# Patient Record
Sex: Male | Born: 1935 | Race: White | Hispanic: No | Marital: Married | Smoking: Former smoker
Health system: Southern US, Community
[De-identification: ages and names within clinical notes are randomized; demographics above are authoritative.]

## PROBLEM LIST (undated history)

## (undated) DIAGNOSIS — Z86010 Personal history of colonic polyps: Secondary | ICD-10-CM

## (undated) DIAGNOSIS — Z85828 Personal history of other malignant neoplasm of skin: Secondary | ICD-10-CM

## (undated) HISTORY — DX: Personal history of other malignant neoplasm of skin: Z85.828

## (undated) HISTORY — PX: OTHER SURGICAL HISTORY: SHX169

## (undated) HISTORY — DX: Personal history of colonic polyps: Z86.010

---

## 2004-04-27 ENCOUNTER — Ambulatory Visit: Payer: Self-pay | Admitting: Internal Medicine

## 2006-05-31 ENCOUNTER — Ambulatory Visit: Payer: Self-pay | Admitting: Internal Medicine

## 2006-07-11 ENCOUNTER — Ambulatory Visit: Payer: Self-pay | Admitting: Internal Medicine

## 2006-07-24 ENCOUNTER — Ambulatory Visit: Payer: Self-pay | Admitting: Internal Medicine

## 2007-04-06 ENCOUNTER — Ambulatory Visit: Payer: Self-pay | Admitting: Internal Medicine

## 2008-03-11 ENCOUNTER — Ambulatory Visit: Payer: Self-pay | Admitting: Internal Medicine

## 2008-06-11 ENCOUNTER — Telehealth: Payer: Self-pay | Admitting: Internal Medicine

## 2008-12-26 ENCOUNTER — Ambulatory Visit: Payer: Self-pay | Admitting: Family Medicine

## 2009-01-05 ENCOUNTER — Ambulatory Visit: Payer: Self-pay | Admitting: Internal Medicine

## 2009-01-30 ENCOUNTER — Ambulatory Visit: Payer: Self-pay | Admitting: Internal Medicine

## 2009-03-05 ENCOUNTER — Encounter (INDEPENDENT_AMBULATORY_CARE_PROVIDER_SITE_OTHER): Payer: Self-pay | Admitting: *Deleted

## 2009-03-23 ENCOUNTER — Ambulatory Visit: Payer: Self-pay | Admitting: Internal Medicine

## 2009-03-23 DIAGNOSIS — D485 Neoplasm of uncertain behavior of skin: Secondary | ICD-10-CM | POA: Insufficient documentation

## 2009-04-27 ENCOUNTER — Encounter (INDEPENDENT_AMBULATORY_CARE_PROVIDER_SITE_OTHER): Payer: Self-pay | Admitting: *Deleted

## 2009-04-28 ENCOUNTER — Ambulatory Visit: Payer: Self-pay | Admitting: Gastroenterology

## 2009-05-14 ENCOUNTER — Ambulatory Visit: Payer: Self-pay | Admitting: Gastroenterology

## 2009-05-14 LAB — HM COLONOSCOPY

## 2009-05-28 ENCOUNTER — Encounter: Payer: Self-pay | Admitting: Gastroenterology

## 2009-07-15 ENCOUNTER — Ambulatory Visit: Payer: Self-pay | Admitting: Internal Medicine

## 2010-02-17 ENCOUNTER — Ambulatory Visit: Payer: Self-pay | Admitting: Internal Medicine

## 2010-06-29 NOTE — Assessment & Plan Note (Signed)
Summary: flu shot/cjr  Nurse Visit   Vitals Entered By: Duard Brady LPN (February 17, 2010 3:57 PM)  Allergies: No Known Drug Allergies  Orders Added: 1)  Admin 1st Vaccine [90471] 2)  Flu Vaccine 60yrs + [57846] Flu Vaccine Consent Questions     Do you have a history of severe allergic reactions to this vaccine? no    Any prior history of allergic reactions to egg and/or gelatin? no    Do you have a sensitivity to the preservative Thimersol? no    Do you have a past history of Guillan-Barre Syndrome? no    Do you currently have an acute febrile illness? no    Have you ever had a severe reaction to latex? no    Vaccine information given and explained to patient? yes    Are you currently pregnant? no    Lot Number:[id number]   Exp Date:11/27/2010   Site Given  Left Deltoid IM .lbflu

## 2010-06-29 NOTE — Assessment & Plan Note (Signed)
Summary: growth on ear/njr   Vital Signs:  Patient profile:   77 year old male Height:      71.5 inches Weight:      190 pounds BMI:     26.22 Temp:     97.9 degrees F oral Pulse rate:   80 / minute Resp:     12 per minute BP sitting:   132 / 86  (left arm) Cuff size:   regular  Vitals Entered By: Gladis Riffle, RN (July 15, 2009 11:02 AM)  Procedure Note Last Tetanus: Historical (01/29/1999)  Mole Biopsy/Removal: Onset of lesion: several months  Procedure # 1: liquid nitrogen    Size (in cm): .5 x .5    Location: right pinna    Comment: verbal consent  CC: c/o abnormal crusting growth on right ear x 3-4 weeks--has had one frozen off before Is Patient Diabetic? No Comments itching of ear has resolved   CC:  c/o abnormal crusting growth on right ear x 3-4 weeks--has had one frozen off before.  History of Present Illness: mole right pinna---tender to touch or sleep on scaley minimally red  Preventive Screening-Counseling & Management  Alcohol-Tobacco     Smoking Status: quit  Current Medications (verified): 1)  Desonide 0.05 % Crea (Desonide) .... Apply Two Times A Day To Ear As Needed Itching  Allergies (verified): No Known Drug Allergies   Impression & Recommendations:  Problem # 1:  LESION, FACE (ICD-238.2)  now on ear---suspect SCCA  Orders: Cryotherapy/Destruction benign or premalignant lesion (1st lesion)  (17000)  Complete Medication List: 1)  Desonide 0.05 % Crea (Desonide) .... Apply two times a day to ear as needed itching

## 2010-08-11 ENCOUNTER — Encounter (HOSPITAL_COMMUNITY): Payer: Medicare HMO | Attending: Pulmonary Disease

## 2010-08-17 ENCOUNTER — Encounter (HOSPITAL_COMMUNITY): Payer: Medicare HMO

## 2010-08-19 ENCOUNTER — Encounter (HOSPITAL_COMMUNITY): Payer: Medicare HMO

## 2010-08-24 ENCOUNTER — Encounter (HOSPITAL_COMMUNITY): Payer: Medicare HMO

## 2010-08-26 ENCOUNTER — Encounter (HOSPITAL_COMMUNITY): Payer: Medicare HMO

## 2010-08-31 ENCOUNTER — Encounter (HOSPITAL_COMMUNITY): Payer: Self-pay

## 2010-09-02 ENCOUNTER — Encounter (HOSPITAL_COMMUNITY): Payer: Self-pay

## 2010-09-07 ENCOUNTER — Encounter (HOSPITAL_COMMUNITY): Payer: Self-pay

## 2010-09-07 ENCOUNTER — Encounter: Payer: Self-pay | Admitting: Internal Medicine

## 2010-09-08 ENCOUNTER — Encounter: Payer: Self-pay | Admitting: Family Medicine

## 2010-09-08 ENCOUNTER — Ambulatory Visit (INDEPENDENT_AMBULATORY_CARE_PROVIDER_SITE_OTHER): Payer: Medicare HMO | Admitting: Family Medicine

## 2010-09-08 VITALS — BP 140/94 | Temp 98.4°F | Ht 70.25 in | Wt 184.0 lb

## 2010-09-08 DIAGNOSIS — L570 Actinic keratosis: Secondary | ICD-10-CM

## 2010-09-08 NOTE — Progress Notes (Signed)
  Subjective:    Patient ID: Brian Welch, male    DOB: [birth year], 77 y.o.   MRN: [id number]  HPI Patient seen with some scaly spots on his face mostly right cheek and right side of nose. Has been treated previously with liquid nitrogen. These have increased over the past several weeks. No bleeding or itching. He is not aware of any prior history of skin cancer. Has had many years of sun exposure to face.   Review of Systems     Objective:   Physical Exam  Constitutional: He is oriented to person, place, and time. He appears well-developed and well-nourished.  Cardiovascular: Normal rate and regular rhythm.  Exam reveals no gallop.   Pulmonary/Chest: Effort normal and breath sounds normal. He has no wheezes. He has no rales.  Neurological: He is alert and oriented to person, place, and time.  Skin:       Patient has a few scattered areas of rash on his face. He has 2 spots on right side of the nose and one right cheek area all of the erythematous base and scaly hyperkeratotic surface. No pigmentary change. Multiple prominent telangiectasias. No clear nodular or ulcerative changes.          Assessment & Plan:  Probable actinic keratoses face. Recommend treatment with liquid nitrogen. After discussion of risks and benefits patient consents. These are treated without difficulty. He is encouraged to followup with Dr. Cato Mulligan in a couple months to reassess

## 2010-09-08 NOTE — Patient Instructions (Signed)
Actinic Keratosis Actinic keratosis is a growth on the skin that is considered to be precancerous. That means it could develop into skin cancer if it is not treated. About 1% of such growths will turn into skin cancer, therefore, it is important that the skin growth is removed. An actinic keratosis might be as small as a pinhead or as big as a quarter. They appear most often on areas of skin that get a lot of sun exposure throughout your life. These include the bald scalp, face, ears, lips, upper back and the backs of hands and forearms. An actinic keratosis usually looks like a scaly, rough spot of skin. Sometimes there might be a little tag of pink or gray skin growing off them.  CAUSES Sun damage causes abnormal growth of skin cells. Actinic keratoses (more than one growth) are the result of sun damage. You are more likely to develop them if you:  Have light-colored skin.   Are older (actinic keratoses increases with age).   Sunburn easily.   Have spent a lot of time in the sun.   Have had a job that involves a lot of outdoor work, such as a Hydrographic surveyor.  SYMPTOMS  Blotchy, red and white patches of skin.   A skin patch that feels rough (like sandpaper), scaly or crusty.   Patches of dry, white skin on the lips.   A patch of skin that is thinner than normal.   Skin that is tender to the touch.   Although a rare symptom; an area of skin that bleeds.  DIAGNOSIS To decide if you have actinic keratosis, your caregiver will probably:  Ask about symptoms you have noticed.   Ask about your history of exposure to the sun.   Ask about your overall health history.   Examine the skin that concerns you. The caregiver may want to look at skin on other parts of your body that have had a lot of sun exposure.   Order a biopsy. A biopsy is the removal of a small sample of tissue from the patch of skin. It is then examined for signs of cancer.  TREATMENT Actinic keratosis can be  treated several ways. Most of the time, treatments can be done in a clinic or in your caregiver's office. Be sure to discuss the different options with your caregiver. They include:  Surgical removal (curettage). A special surgical instrument (a curette) is used to scrape the growth.   Cryosurgery. Liquid nitrogen is used to freeze the patch of skin. Often it is sprayed on the area. The growth will eventually fall off.   5-FU (5-fluorouracil) cream. The cream is applied several times a day for up to 4 weeks. The skin often becomes red and irritated, but the growths will go away. This method is often used if actinic keratoses or the skin is badly damaged.   Chemical peel. Chemicals are applied to the skin on a small spot. The outer layers of skin at that spot are then peeled off.   Photodynamic therapy. A drug (photosensitizing agent) is put on the skin before exposing the skin to a strong light. Together, the drug and strong light destroys the actinic keratoses.   Imiquimod cream. A medicine usually applied to growths on the face or scalp.  PROGNOSIS Early treatment of actinic keratoses usually gets rid of the growths without further worries. POSSIBLE COMPLICATIONS  Skin irritation or redness from the treatment.   Scarring where the patch of skin was  removed.   Development of skin cancer. This rarely happens if the growths are removed early on.  HOME CARE INSTRUCTIONS  An adhesive bandage, and possibly gauze, will cover the treated area.   Change and remove the bandage as directed by your caregiver.   Keep the treated area dry as directed by your caregiver.   Apply any creams that your caregiver prescribed. Follow the directions carefully.   To prevent future skin damage:   Wear sunscreen year-round, not just in the summer. The winter sun can damage skin, too.   Wear long-sleeved clothing and wide-brimmed hats.   When possible, avoid midday exposure to the sun.   If you want  to look tan, try sunless tanning products (lotions and sprays). Avoid tanning beds.   Commit to regularly checking your skin for new changes.   Visit a skin doctor (dermatologist ) every year for a skin exam.  SEEK MEDICAL CARE IF:  The treated area of skin does not heal and becomes more irritated, red or bloody.   You notice other patches of skin that are similar to the actinic keratoses that were treated.  Document Released: 08/12/2008 Document Re-Released: 11/03/2009 [facility] Patient Information 2011 [place], [place].

## 2010-09-09 ENCOUNTER — Encounter (HOSPITAL_COMMUNITY): Payer: Self-pay

## 2010-09-14 ENCOUNTER — Encounter (HOSPITAL_COMMUNITY): Payer: Self-pay

## 2010-09-16 ENCOUNTER — Encounter (HOSPITAL_COMMUNITY): Payer: Self-pay

## 2010-09-21 ENCOUNTER — Encounter (HOSPITAL_COMMUNITY): Payer: Self-pay

## 2010-09-23 ENCOUNTER — Encounter (HOSPITAL_COMMUNITY): Payer: Self-pay

## 2010-09-28 ENCOUNTER — Encounter (HOSPITAL_COMMUNITY): Payer: Self-pay

## 2010-09-30 ENCOUNTER — Encounter (HOSPITAL_COMMUNITY): Payer: Self-pay

## 2010-10-05 ENCOUNTER — Encounter (HOSPITAL_COMMUNITY): Payer: Self-pay

## 2010-10-07 ENCOUNTER — Encounter (HOSPITAL_COMMUNITY): Payer: Self-pay

## 2010-10-12 ENCOUNTER — Encounter (HOSPITAL_COMMUNITY): Payer: Self-pay

## 2010-10-14 ENCOUNTER — Encounter (HOSPITAL_COMMUNITY): Payer: Self-pay

## 2010-10-19 ENCOUNTER — Encounter (HOSPITAL_COMMUNITY): Payer: Self-pay

## 2010-10-21 ENCOUNTER — Encounter (HOSPITAL_COMMUNITY): Payer: Self-pay

## 2010-10-26 ENCOUNTER — Encounter (HOSPITAL_COMMUNITY): Payer: Self-pay

## 2010-10-28 ENCOUNTER — Encounter (HOSPITAL_COMMUNITY): Payer: Self-pay

## 2010-11-02 ENCOUNTER — Encounter (HOSPITAL_COMMUNITY): Payer: Self-pay

## 2010-11-04 ENCOUNTER — Encounter (HOSPITAL_COMMUNITY): Payer: Self-pay

## 2010-11-09 ENCOUNTER — Encounter (HOSPITAL_COMMUNITY): Payer: Self-pay

## 2010-11-10 ENCOUNTER — Encounter: Payer: Self-pay | Admitting: Internal Medicine

## 2010-11-10 ENCOUNTER — Ambulatory Visit (INDEPENDENT_AMBULATORY_CARE_PROVIDER_SITE_OTHER): Payer: Medicare HMO | Admitting: Internal Medicine

## 2010-11-10 DIAGNOSIS — L570 Actinic keratosis: Secondary | ICD-10-CM

## 2010-11-10 DIAGNOSIS — D485 Neoplasm of uncertain behavior of skin: Secondary | ICD-10-CM

## 2010-11-11 ENCOUNTER — Encounter (HOSPITAL_COMMUNITY): Payer: Self-pay

## 2010-11-16 ENCOUNTER — Encounter (HOSPITAL_COMMUNITY): Payer: Self-pay

## 2010-11-18 ENCOUNTER — Encounter (HOSPITAL_COMMUNITY): Payer: Self-pay

## 2010-11-18 NOTE — Progress Notes (Signed)
  Subjective:    Patient ID: Brian Welch, male    DOB: 08/11/1931, 77 y.o.   MRN: [id number]  HPI  77 year old male comes in for evaluation. He really has no significant complaints. He is concerned with a few lesions on his skin.No past medical history on file. No past surgical history on file.  reports that he quit smoking about 37 years ago. His smoking use included Cigarettes. He has a 15 pack-year smoking history. He does not have any smokeless tobacco history on file. His alcohol and drug histories not on file. family history includes Stroke in his other and Tuberculosis in his other. No Known Allergies   Review of Systems  patient denies chest pain, shortness of breath, orthopnea. Denies lower extremity edema, abdominal pain, change in appetite, change in bowel movements. Patient denies rashes, musculoskeletal complaints. No other specific complaints in a complete review of systems.      Objective:   Physical Exam Well-developed male in no acute distress. Full skin check does not reveal any concerning lesions. He does have a few actinic keratoses.       Assessment & Plan:

## 2010-11-18 NOTE — Assessment & Plan Note (Signed)
Small actinic keratoses without any concerning findings. No treatment necessary.

## 2010-11-23 ENCOUNTER — Encounter (HOSPITAL_COMMUNITY): Payer: Self-pay

## 2010-11-25 ENCOUNTER — Encounter (HOSPITAL_COMMUNITY): Payer: Self-pay

## 2011-04-13 ENCOUNTER — Ambulatory Visit: Payer: Medicare HMO | Admitting: *Deleted

## 2011-04-14 ENCOUNTER — Ambulatory Visit: Payer: Medicare HMO | Admitting: *Deleted

## 2011-08-05 ENCOUNTER — Ambulatory Visit (INDEPENDENT_AMBULATORY_CARE_PROVIDER_SITE_OTHER): Payer: Medicare Other | Admitting: Family

## 2011-08-05 ENCOUNTER — Encounter: Payer: Self-pay | Admitting: Family

## 2011-08-05 ENCOUNTER — Ambulatory Visit: Payer: Medicare HMO | Admitting: Family

## 2011-08-05 VITALS — BP 138/88 | Temp 97.5°F | Wt 186.0 lb

## 2011-08-05 DIAGNOSIS — D489 Neoplasm of uncertain behavior, unspecified: Secondary | ICD-10-CM

## 2011-08-05 NOTE — Progress Notes (Signed)
  Subjective:    Patient ID: Brian Welch, male    DOB: 06/03/1931, 78 y.o.   MRN: [id number]  HPI 78 y/o WM is in today with c/o lesion on his right nose and left ear. He is requesting to have them removed.    Review of Systems  Constitutional: Negative.   Respiratory: Negative.   Cardiovascular: Negative.   Skin:       Skin lesion to the right nose and left ear.   Hematological: Negative.        Objective:   Physical Exam  Constitutional: He is oriented to person, place, and time. He appears well-developed and well-nourished.  Cardiovascular: Normal rate, regular rhythm and normal heart sounds.   Pulmonary/Chest: Effort normal and breath sounds normal.  Neurological: He is alert and oriented to person, place, and time.  Skin: Skin is dry.       Right nose: palpable flesh tone, vascular appearing lesion noted. Approx 3mm in size.   Left ear: Rough 2mm lesion noted to the left tip of the ear. Flesh tone.    Right nose: Informed consent was given by the patient for a shave biopsy. The site was prepped with Betadine and using a 15 blade a 4mm shave biopsy was obtained. The specimin was placed in preservative and sent for pathology. Hemostasis was achieved with a compression. Wound care was discussed with the patient. The patient was informed that it would be one to 2 weeks before the pathology will be interpreted.  Left ear: Informed consent was given by the patient for a shave biopsy. The site was prepped with Betadine and using a 15 blade a 2mm shave biopsy was obtained. The specimin was placed in preservative and sent for pathology. Hemostasis was achieved with a compression. Wound care was discussed with the patient. The patient was informed that it would be one to 2 weeks before the pathology will be interpreted.      Assessment & Plan:  Assessment: Neoplasm uncertain significance  Plan: Lesions sent to pathology. Apply triple antibiotic ointment. Discussed s/s of infection.  Recheck as scheduled and prn.

## 2011-10-12 ENCOUNTER — Telehealth: Payer: Self-pay | Admitting: *Deleted

## 2011-10-12 ENCOUNTER — Encounter: Payer: Self-pay | Admitting: Internal Medicine

## 2011-10-12 ENCOUNTER — Ambulatory Visit (INDEPENDENT_AMBULATORY_CARE_PROVIDER_SITE_OTHER): Payer: Medicare Other | Admitting: Internal Medicine

## 2011-10-12 VITALS — BP 160/90 | HR 64 | Temp 98.7°F | Ht 71.0 in | Wt 179.0 lb

## 2011-10-12 DIAGNOSIS — Z0000 Encounter for general adult medical examination without abnormal findings: Secondary | ICD-10-CM

## 2011-10-12 DIAGNOSIS — Z23 Encounter for immunization: Secondary | ICD-10-CM

## 2011-10-12 NOTE — Telephone Encounter (Signed)
Per Dr. Cato Mulligan, no need for labs or urine, and wife notified.

## 2011-10-12 NOTE — Telephone Encounter (Signed)
Can call patient. Really not necessary, there is nothing to order

## 2011-10-12 NOTE — Progress Notes (Signed)
Patient ID: Brian Welch, male   DOB: 02/27/1932, 78 y.o.   MRN: [id number] cpx  No past medical history on file.  History   Social History  . Marital Status: Married    Spouse Name: N/A    Number of Children: N/A  . Years of Education: N/A   Occupational History  . Not on file.   Social History Main Topics  . Smoking status: Former Smoker -- 1.0 packs/day for 15 years    Types: Cigarettes    Quit date: 09/07/1973  . Smokeless tobacco: Not on file  . Alcohol Use: Not on file  . Drug Use: Not on file  . Sexually Active: Not on file   Other Topics Concern  . Not on file   Social History Narrative  . No narrative on file    No past surgical history on file.  Family History  Problem Relation Age of Onset  . Stroke Other   . Tuberculosis Other     No Known Allergies  No current outpatient prescriptions on file prior to visit.     patient denies chest pain, shortness of breath, orthopnea. Denies lower extremity edema, abdominal pain, change in appetite, change in bowel movements. Patient denies rashes, musculoskeletal complaints. No other specific complaints in a complete review of systems.   BP 160/90  Pulse 64  Temp(Src) 98.7 F (37.1 C) (Oral)  Ht 5\' 11"  (1.803 m)  Wt 179 lb (81.194 kg)  BMI 24.97 kg/m2 Well-developed male in no acute distress. HEENT exam atraumatic, normocephalic, extraocular muscles are intact. Conjunctivae are pink without exudate. Neck is supple without lymphadenopathy, thyromegaly, jugular venous distention. Chest is clear to auscultation without increased work of breathing. Cardiac exam S1-S2 are regular. The PMI is normal. No significant murmurs or gallops. Abdominal exam active bowel sounds, soft, nontender. No abdominal bruits. Extremities no clubbing cyanosis or edema. Peripheral pulses are normal without bruits. Neurologic exam alert and oriented without any motor or sensory deficits.  Well Visit: health maint UTD He has no  identifiable medical problems

## 2011-10-12 NOTE — Patient Instructions (Signed)
Call your insurance company and see if they will cover shingles vaccine. If they will, call us and we will give it to you  

## 2011-10-12 NOTE — Telephone Encounter (Signed)
Wife called and thought that her husband was supposed to have labs and urine this am.  Please call her and let her know if we should do that.

## 2012-03-13 ENCOUNTER — Ambulatory Visit (INDEPENDENT_AMBULATORY_CARE_PROVIDER_SITE_OTHER): Payer: Medicare Other

## 2012-03-13 DIAGNOSIS — Z23 Encounter for immunization: Secondary | ICD-10-CM

## 2012-12-17 ENCOUNTER — Ambulatory Visit (INDEPENDENT_AMBULATORY_CARE_PROVIDER_SITE_OTHER): Payer: Medicare Other | Admitting: Internal Medicine

## 2012-12-17 ENCOUNTER — Encounter: Payer: Self-pay | Admitting: Internal Medicine

## 2012-12-17 VITALS — BP 140/80 | HR 92 | Temp 97.5°F | Resp 18 | Wt 185.0 lb

## 2012-12-17 DIAGNOSIS — L570 Actinic keratosis: Secondary | ICD-10-CM

## 2012-12-17 NOTE — Progress Notes (Signed)
  Subjective:    Patient ID: Brian Welch, male    DOB: [birth year], 79 y.o.   MRN: [id number]  HPI  79 year old patient who presents with a chief complaint of facial skin lesions. He states that he has had a number of actinic keratoses removed or treated with cryotherapy.  He has seen Dr. Terri Piedra in the past. Last year he did have a very small BCE remove the left ear  No past medical history on file.  History   Social History  . Marital Status: Married    Spouse Name: N/A    Number of Children: N/A  . Years of Education: N/A   Occupational History  . Not on file.   Social History Main Topics  . Smoking status: Former Smoker -- 1.00 packs/day for 15 years    Types: Cigarettes    Quit date: 09/07/1973  . Smokeless tobacco: Not on file  . Alcohol Use: Not on file  . Drug Use: Not on file  . Sexually Active: Not on file   Other Topics Concern  . Not on file   Social History Narrative  . No narrative on file    No past surgical history on file.  Family History  Problem Relation Age of Onset  . Stroke Other   . Tuberculosis Other     No Known Allergies  Current Outpatient Prescriptions on File Prior to Visit  Medication Sig Dispense Refill  . Cholecalciferol (VITAMIN D-3 PO) Take by mouth.      . co-enzyme Q-10 30 MG capsule Take 30 mg by mouth 3 (three) times daily.      . Garlic 10 MG CAPS Take by mouth.      . Multiple Vitamins-Minerals (EYE VITAMINS PO) Take by mouth.      . NON FORMULARY k-2      . Omega-3 Fatty Acids (FISH OIL) 1000 MG CPDR Take by mouth.      . RESVERATROL 100 MG CAPS Take by mouth.       No current facility-administered medications on file prior to visit.    BP 140/80  Pulse 92  Temp(Src) 97.5 F (36.4 C) (Oral)  Resp 18  Wt 185 lb (83.915 kg)  BMI 25.81 kg/m2  SpO2 96%       Review of Systems  Skin: Positive for rash.       Objective:   Physical Exam  HENT:  Examinational left ear revealed no evidence of recurrent BCE   Skin:  Face with moderate solar changes Scattered very small actinic keratoses were noted especially involving the right temporal area          Assessment & Plan:   Actinic keratoses. It was suggested that he followup with Dr. Terri Piedra on an annual basis. He was also encouraged to schedule an annual exam with his primary care provider

## 2012-12-17 NOTE — Patient Instructions (Signed)
Follow Dr. Terri Piedra for annual skin exam  Schedule a annual exam with your primary care provider at your convenience

## 2013-02-19 ENCOUNTER — Ambulatory Visit (INDEPENDENT_AMBULATORY_CARE_PROVIDER_SITE_OTHER): Payer: Medicare Other

## 2013-02-19 DIAGNOSIS — Z23 Encounter for immunization: Secondary | ICD-10-CM

## 2014-02-10 ENCOUNTER — Ambulatory Visit (INDEPENDENT_AMBULATORY_CARE_PROVIDER_SITE_OTHER): Payer: Medicare HMO | Admitting: *Deleted

## 2014-02-10 DIAGNOSIS — Z23 Encounter for immunization: Secondary | ICD-10-CM

## 2014-02-26 ENCOUNTER — Encounter: Payer: Self-pay | Admitting: Gastroenterology

## 2014-05-26 ENCOUNTER — Ambulatory Visit: Payer: Medicare Other | Admitting: Family Medicine

## 2014-06-16 ENCOUNTER — Encounter: Payer: Self-pay | Admitting: Family Medicine

## 2014-06-16 DIAGNOSIS — Z860100 Personal history of colon polyps, unspecified: Secondary | ICD-10-CM

## 2014-06-16 DIAGNOSIS — Z85828 Personal history of other malignant neoplasm of skin: Secondary | ICD-10-CM

## 2014-06-16 HISTORY — DX: Personal history of other malignant neoplasm of skin: Z85.828

## 2014-06-16 HISTORY — DX: Personal history of colonic polyps: Z86.010

## 2014-06-16 HISTORY — DX: Personal history of colon polyps, unspecified: Z86.0100

## 2014-06-17 ENCOUNTER — Encounter: Payer: Self-pay | Admitting: Family Medicine

## 2014-06-17 ENCOUNTER — Ambulatory Visit (INDEPENDENT_AMBULATORY_CARE_PROVIDER_SITE_OTHER): Payer: Medicare HMO | Admitting: Family Medicine

## 2014-06-17 VITALS — BP 138/82 | Temp 97.5°F | Wt 172.0 lb

## 2014-06-17 DIAGNOSIS — IMO0001 Reserved for inherently not codable concepts without codable children: Secondary | ICD-10-CM

## 2014-06-17 DIAGNOSIS — Z23 Encounter for immunization: Secondary | ICD-10-CM

## 2014-06-17 DIAGNOSIS — Z0000 Encounter for general adult medical examination without abnormal findings: Secondary | ICD-10-CM

## 2014-06-17 LAB — COMPREHENSIVE METABOLIC PANEL
ALBUMIN: 4.4 g/dL (ref 3.5–5.2)
ALT: 20 U/L (ref 0–53)
AST: 22 U/L (ref 0–37)
Alkaline Phosphatase: 64 U/L (ref 39–117)
BILIRUBIN TOTAL: 0.6 mg/dL (ref 0.2–1.2)
BUN: 16 mg/dL (ref 6–23)
CALCIUM: 9.6 mg/dL (ref 8.4–10.5)
CO2: 30 mEq/L (ref 19–32)
Chloride: 103 mEq/L (ref 96–112)
Creatinine, Ser: 0.89 mg/dL (ref 0.40–1.50)
GFR: 87.31 mL/min (ref >60.00)
Glucose, Bld: 101 mg/dL — ABNORMAL HIGH (ref 70–99)
Potassium: 4.1 mEq/L (ref 3.5–5.1)
SODIUM: 138 meq/L (ref 135–145)
TOTAL PROTEIN: 7.3 g/dL (ref 6.0–8.3)

## 2014-06-17 LAB — CBC
HEMATOCRIT: 49.9 % (ref 39.0–52.0)
HEMOGLOBIN: 16.7 g/dL (ref 13.0–17.0)
MCHC: 33.4 g/dL (ref 30.0–36.0)
MCV: 99.1 fl (ref 78.0–100.0)
PLATELETS: 228 10*3/uL (ref 150.0–400.0)
RBC: 5.04 Mil/uL (ref 4.22–5.81)
RDW: 12.7 % (ref 11.5–15.5)
WBC: 9 10*3/uL (ref 4.0–10.5)

## 2014-06-17 NOTE — Progress Notes (Signed)
Garret Reddish, MD Phone: [phone]  Subjective:  Patient presents today for their annual physical. Chief complaint-noted.   Home BP monitoring-last few home readings are 115/73, 126/68, 118/77, 124/73  Superoxide diometoze before breakfast With breakfast takes garlic, eye vitamins (cataract surgery, sees eye doctor yearly) Oatmeal with some prunes, coconut oil, protein powder Tumeric every other day, coq10, d3, reserveterol  Lunch-1/3 a can of sardines, 1/3 avocado, leftover vegetable, makes smoothie out of romaine, kale, magnesium, green powder, whey protein  Walk daily 2 miles, bikes sometimes, pushups twice a week along with yoga poses along with loud music. Yoga one day a week and zumba on Sunday. Chops wood in winter.   1 banana per day.   Dinner small portions of meat or fish (seldom beef, avoids sausage). Vegetables with dinner.   Sees dentist yearly. Had some gum recession. Starting on probiotic.   ROS- Full ROS completed and negative including Denies any CP, HA, SOB, blurry vision, LE edema, transient weakness, orthopnea, PND.   The following were reviewed and entered/updated in epic: Past Medical History  Diagnosis Date  . History of basal cell carcinoma 06/16/2014    2013 Left ear   . History of colonic polyps 06/16/2014    Adenoma 2010. Advised 5 year follow up.     Patient Active Problem List   Diagnosis Date Noted  . History of colonic polyps 06/16/2014  . History of basal cell carcinoma 06/16/2014   Past Surgical History  Procedure Laterality Date  . None      Family History  Problem Relation Age of Onset  . Stroke Mother     58 , nonsmoker. lived to 20  . Tuberculosis Father     died 3 months before patient born    Medications- reviewed and updated Current Outpatient Prescriptions  Medication Sig Dispense Refill  . Cholecalciferol (VITAMIN D-3 PO) Take by mouth.    . co-enzyme Q-10 30 MG capsule Take 30 mg by mouth 3 (three) times daily.    .  Garlic 10 MG CAPS Take by mouth.    . Multiple Vitamins-Minerals (EYE VITAMINS PO) Take by mouth.    . NON FORMULARY k-2    . Omega-3 Fatty Acids (FISH OIL) 1000 MG CPDR Take by mouth.    . RESVERATROL 100 MG CAPS Take by mouth.     No current facility-administered medications for this visit.    Allergies-reviewed and updated No Known Allergies  History   Social History  . Marital Status: Married    Spouse Name: N/A    Number of Children: N/A  . Years of Education: N/A   Social History Main Topics  . Smoking status: Former Smoker -- 1.00 packs/day for 15 years    Types: Cigarettes    Quit date: 09/07/1973  . Smokeless tobacco: None  . Alcohol Use: No  . Drug Use: No  . Sexual Activity: None   Other Topics Concern  . None   Social History Narrative   Married (wife Arbie Cookey patient of Dr. Yong Channel). 1 son. No grandchildren.       Retired from Public relations account executive: exercise, see movies, plays and musicals    ROS--See HPI   Objective: BP 138/82 mmHg  Temp(Src) 97.5 F (36.4 C)  Wt 172 lb (78.019 kg) Gen: NAD, resting comfortably HEENT: Mucous membranes are moist. Oropharynx normal. Good dentition considering age Neck: no thyromegaly, no lymphadenopathy CV: RRR no murmurs rubs or gallops Lungs: CTAB no crackles, wheeze,  rhonchi Abdomen: soft/nontender/nondistended/normal bowel sounds. No rebound or guarding.  Ext: no edema, 2+ PT pulses Skin: warm, dry, several cuts and scrapes on hands from chopping wood, back with several seborrheic keratosis but no suspicious lesoins Neuro: normal gait, climbs onto table without difficulty, PERRLA   Assessment/Plan:  81 y.o. male presenting for annual physical.  Health Maintenance counseling: 1. Anticipatory guidance: Patient counseled regarding regular dental exams, wearing seatbelts, wearing sunscreen 2. Risk factor reduction:  Advised patient of need for regular exercise and diet rich and fruits and  vegetables to reduce risk of heart attack and stroke.  3. Immunizations/screenings/ancillary studies- up to date after being given prevnar today 4. Update labs. No lipids as would not be candidate for primary prevention.  nonfasting-only concern would be glucose.  Orders Placed This Encounter  Procedures  . Pneumococcal conjugate vaccine 13-valent  . CBC    [facility]  . Comprehensive metabolic panel    [facility]  5. 1 year follow up.

## 2014-06-17 NOTE — Patient Instructions (Addendum)
I think you are doing great. i will see you in a year for another physical. Update fasting labs today (not checking cholesterol as would not start you on cholesterol medicine given age and no heart attack or stroke.   We updated your pneumonia vaccine (final booster)

## 2014-10-08 ENCOUNTER — Encounter: Payer: Self-pay | Admitting: Gastroenterology

## 2015-03-25 ENCOUNTER — Encounter: Payer: Self-pay | Admitting: Gastroenterology

## 2016-01-29 DIAGNOSIS — H2511 Age-related nuclear cataract, right eye: Secondary | ICD-10-CM | POA: Diagnosis not present

## 2016-01-29 DIAGNOSIS — H5213 Myopia, bilateral: Secondary | ICD-10-CM | POA: Diagnosis not present

## 2016-01-29 DIAGNOSIS — H26492 Other secondary cataract, left eye: Secondary | ICD-10-CM | POA: Diagnosis not present

## 2016-01-29 DIAGNOSIS — H35372 Puckering of macula, left eye: Secondary | ICD-10-CM | POA: Diagnosis not present

## 2016-02-09 DIAGNOSIS — R69 Illness, unspecified: Secondary | ICD-10-CM | POA: Diagnosis not present

## 2016-02-25 DIAGNOSIS — H26492 Other secondary cataract, left eye: Secondary | ICD-10-CM | POA: Diagnosis not present

## 2016-03-16 ENCOUNTER — Ambulatory Visit (INDEPENDENT_AMBULATORY_CARE_PROVIDER_SITE_OTHER): Payer: Medicare HMO | Admitting: Family Medicine

## 2016-03-16 DIAGNOSIS — Z23 Encounter for immunization: Secondary | ICD-10-CM

## 2016-08-09 DIAGNOSIS — R69 Illness, unspecified: Secondary | ICD-10-CM | POA: Diagnosis not present

## 2016-11-16 DIAGNOSIS — R69 Illness, unspecified: Secondary | ICD-10-CM | POA: Diagnosis not present

## 2016-11-24 ENCOUNTER — Telehealth: Payer: Self-pay

## 2016-11-24 NOTE — Telephone Encounter (Signed)
Patient is on the list for [employer] 2018 and may be a good candidate for an AWV. Please let me know if/when appt is scheduled.   

## 2017-02-08 DIAGNOSIS — H52203 Unspecified astigmatism, bilateral: Secondary | ICD-10-CM | POA: Diagnosis not present

## 2017-02-08 DIAGNOSIS — H2511 Age-related nuclear cataract, right eye: Secondary | ICD-10-CM | POA: Diagnosis not present

## 2017-02-08 DIAGNOSIS — H35372 Puckering of macula, left eye: Secondary | ICD-10-CM | POA: Diagnosis not present

## 2017-02-13 DIAGNOSIS — R69 Illness, unspecified: Secondary | ICD-10-CM | POA: Diagnosis not present

## 2017-02-16 ENCOUNTER — Ambulatory Visit (INDEPENDENT_AMBULATORY_CARE_PROVIDER_SITE_OTHER): Payer: Medicare HMO

## 2017-02-16 DIAGNOSIS — Z23 Encounter for immunization: Secondary | ICD-10-CM | POA: Diagnosis not present

## 2017-03-10 ENCOUNTER — Ambulatory Visit (INDEPENDENT_AMBULATORY_CARE_PROVIDER_SITE_OTHER): Payer: Medicare HMO | Admitting: Family Medicine

## 2017-03-10 ENCOUNTER — Encounter: Payer: Self-pay | Admitting: Family Medicine

## 2017-03-10 VITALS — BP 128/80 | HR 63 | Temp 97.4°F | Ht 71.0 in | Wt 159.6 lb

## 2017-03-10 DIAGNOSIS — Z0000 Encounter for general adult medical examination without abnormal findings: Secondary | ICD-10-CM | POA: Diagnosis not present

## 2017-03-10 DIAGNOSIS — Z79899 Other long term (current) drug therapy: Secondary | ICD-10-CM | POA: Diagnosis not present

## 2017-03-10 DIAGNOSIS — Z86010 Personal history of colonic polyps: Secondary | ICD-10-CM | POA: Diagnosis not present

## 2017-03-10 DIAGNOSIS — Z87891 Personal history of nicotine dependence: Secondary | ICD-10-CM

## 2017-03-10 DIAGNOSIS — Z13220 Encounter for screening for lipoid disorders: Secondary | ICD-10-CM

## 2017-03-10 LAB — COMPREHENSIVE METABOLIC PANEL
ALT: 19 U/L (ref 0–53)
AST: 21 U/L (ref 0–37)
Albumin: 4.6 g/dL (ref 3.5–5.2)
Alkaline Phosphatase: 64 U/L (ref 39–117)
BUN: 11 mg/dL (ref 6–23)
CALCIUM: 9.1 mg/dL (ref 8.4–10.5)
CHLORIDE: 102 meq/L (ref 96–112)
CO2: 30 meq/L (ref 19–32)
CREATININE: 0.83 mg/dL (ref 0.40–1.50)
GFR: 94 mL/min (ref >60.00)
GLUCOSE: 89 mg/dL (ref 70–99)
Potassium: 4.4 mEq/L (ref 3.5–5.1)
SODIUM: 140 meq/L (ref 135–145)
Total Bilirubin: 0.9 mg/dL (ref 0.2–1.2)
Total Protein: 7.2 g/dL (ref 6.0–8.3)

## 2017-03-10 LAB — POC URINALSYSI DIPSTICK (AUTOMATED)
BILIRUBIN UA: NEGATIVE
Blood, UA: NEGATIVE
GLUCOSE UA: NEGATIVE
Ketones, UA: NEGATIVE
Leukocytes, UA: NEGATIVE
Nitrite, UA: NEGATIVE
Protein, UA: NEGATIVE
SPEC GRAV UA: 1.015 (ref 1.010–1.025)
UROBILINOGEN UA: 0.2 U/dL
pH, UA: 7 (ref 5.0–8.0)

## 2017-03-10 LAB — CBC
HCT: 50.3 % (ref 39.0–52.0)
Hemoglobin: 16.9 g/dL (ref 13.0–17.0)
MCHC: 33.7 g/dL (ref 30.0–36.0)
MCV: 100.6 fl — AB (ref 78.0–100.0)
PLATELETS: 246 10*3/uL (ref 150.0–400.0)
RBC: 5 Mil/uL (ref 4.22–5.81)
RDW: 12.7 % (ref 11.5–15.5)
WBC: 7.1 10*3/uL (ref 4.0–10.5)

## 2017-03-10 LAB — LIPID PANEL
CHOL/HDL RATIO: 5
Cholesterol: 198 mg/dL (ref 0–200)
HDL: 42.8 mg/dL (ref >39.00)
LDL CALC: 133 mg/dL — AB (ref 0–99)
NONHDL: 155.1
TRIGLYCERIDES: 113 mg/dL (ref 0.0–149.0)
VLDL: 22.6 mg/dL (ref 0.0–40.0)

## 2017-03-10 NOTE — Patient Instructions (Addendum)
Consider Shingrix- the new shingles vaccination that is 90% effective. Would get this at your pharmacy if they have it available  We will call you within a week or two about your referral to GI for colonoscopy. If you do not hear within 3 weeks, give [place] a call.   You are doing fantastic! Listen to your body and only slow down if you are hurting- jumping is fine as long as you feel ok with it.

## 2017-03-10 NOTE — Progress Notes (Signed)
Phone: [phone]  Subjective:  Patient presents today for their annual physical. Chief complaint-noted.   See problem oriented charting- ROS- full  review of systems was completed and negative including No chest pain or shortness of breath. No headache or blurry vision. Mild symptoms of seasonal allergies ans sneezing and hoarseness at times  The following were reviewed and entered/updated in epic: Past Medical History:  Diagnosis Date  . History of basal cell carcinoma 06/16/2014   2013 Left ear   . History of colonic polyps 06/16/2014   Adenoma 2010. Advised 5 year follow up.     Patient Active Problem List   Diagnosis Date Noted  . History of colonic polyps 06/16/2014  . History of basal cell carcinoma 06/16/2014   Past Surgical History:  Procedure Laterality Date  . left eye cataract removal      Family History  Problem Relation Age of Onset  . Stroke Mother        45, nonsmoker. lived to 98  . Tuberculosis Father        died 3 months before patient born    Medications- reviewed and updated Current Outpatient Prescriptions  Medication Sig Dispense Refill  . Cholecalciferol (VITAMIN D-3 PO) Take by mouth.    . co-enzyme Q-10 30 MG capsule Take 30 mg by mouth daily.     . Garlic 10 MG CAPS Take by mouth.    . Multiple Vitamins-Minerals (EYE VITAMINS PO) Take by mouth.    . NON FORMULARY k-2    . Omega-3 Fatty Acids (FISH OIL) 1000 MG CPDR Take by mouth.    . RESVERATROL 100 MG CAPS Take by mouth.     No current facility-administered medications for this visit.     Allergies-reviewed and updated No Known Allergies  Social History   Social History  . Marital status: Married    Spouse name: N/A  . Number of children: N/A  . Years of education: N/A   Social History Main Topics  . Smoking status: Former Smoker    Packs/day: 1.00    Years: 15.00    Types: Cigarettes    Quit date: 09/07/1973  . Smokeless tobacco: Never Used  . Alcohol use No  . Drug  use: No  . Sexual activity: Not Asked   Other Topics Concern  . None   Social History Narrative   Married (wife Arbie Cookey patient of Dr. Yong Channel). 1 son. No grandchildren.       Retired from Public relations account executive: exercise, see movies, plays and musicals    Objective: BP 128/80 (BP Location: Left Arm, Patient Position: Sitting, Cuff Size: Large)   Pulse 63   Temp (!) 97.4 F (36.3 C) (Oral)   Ht 5\' 11"  (1.803 m)   Wt 159 lb 9.6 oz (72.4 kg)   SpO2 97%   BMI 22.26 kg/m  Gen: NAD, resting comfortably, appears younger than stated age, athletic for age 77: Mucous membranes are moist. Oropharynx normal Neck: no thyromegaly CV: RRR no murmurs rubs or gallops Lungs: CTAB no crackles, wheeze, rhonchi Abdomen: soft/nontender/nondistended/normal bowel sounds. No rebound or guarding.  Ext: no edema Skin: warm, dry Neuro: grossly normal, moves all extremities, PERRLA  Assessment/Plan:  83 y.o. male presenting for annual physical.  Health Maintenance counseling: 1. Anticipatory guidance: Patient counseled regarding regular dental exams q6 months, eye exams - yearly, wearing seatbelts.  2. Risk factor reduction:  Advised patient of need for regular exercise and diet rich and fruits  and vegetables to reduce risk of heart attack and stroke. Exercise- walking daily, hour of zumba on sunday. Diet- Has lost 13 lbs in last 2 years and lost 13 lbs in prior 2 years. Cut unhealthy overeating patterns like whole box of graham crackers at a time.  Wt Readings from Last 3 Encounters:  03/10/17 159 lb 9.6 oz (72.4 kg)  06/17/14 172 lb (78 kg)  12/17/12 185 lb (83.9 kg)  3. Immunizations/screenings/ancillary studies- discussed shingrix at pharmacy  Immunization History  Administered Date(s) Administered  . Influenza Split 03/13/2012  . Influenza Whole 04/06/2007, 03/11/2008, 03/23/2009, 02/17/2010  . Influenza, High Dose Seasonal PF 02/16/2017  . Influenza,inj,Quad PF,6+ Mos  02/19/2013, 02/10/2014, 03/16/2016  . Influenza-Unspecified 02/25/2015  . Pneumococcal Conjugate-13 06/17/2014  . Pneumococcal Polysaccharide-23 02/17/2004  . Td 01/29/1999  . Tdap 10/12/2011  . Zoster 05/30/2008  4. Prostate cancer screening- passed age based screening recommendations  5. Colon cancer screening - adenoma 2010. Was advised repeat colonoscopy from Dr. Fuller Plan in 2016. Referred today 6. Skin cancer screening- advised regular sunscreen use- he declines. Denies worrisome, changing, or new skin lesions. Wears hat and tries to avoid peak hours of sun intensity. No obvious precancerous or cancerous lesions  Status of chronic or acute concerns   Long term med use- get CBC and CMP given supplements  Screen HLD- with lipid panel  Wants vitamin D checked- no history insufficiency- may check with lab test anywhere.   1 year physical  Orders Placed This Encounter  Procedures  . Lipid panel    [place]    Order Specific Question:   Has the patient fasted?    Answer:   No  . CBC    [place]  . Comprehensive metabolic panel    [place]    Order Specific Question:   Has the patient fasted?    Answer:   No  . Ambulatory referral to Gastroenterology    Referral Priority:   Routine    Referral Type:   Consultation    Referral Reason:   Specialty Services Required    Number of Visits Requested:   1  . POCT Urinalysis Dipstick (Automated)   Return precautions advised.  Garret Reddish, MD

## 2017-04-13 ENCOUNTER — Encounter: Payer: Self-pay | Admitting: Family Medicine

## 2017-04-13 ENCOUNTER — Telehealth: Payer: Self-pay

## 2017-04-13 NOTE — Telephone Encounter (Signed)
Called and spoke with patient. I informed him GI had reached out to Dr. Yong Channel as they had not been able to get in to contact with him. He stated his wife is about to have eye surgery and she has had a lot of appointments. I provided him with the telephone number and encouraged him to call and schedule an appointment. He verbalized understanding.

## 2017-07-17 ENCOUNTER — Ambulatory Visit: Payer: Self-pay

## 2017-07-17 NOTE — Telephone Encounter (Signed)
noted 

## 2017-07-17 NOTE — Telephone Encounter (Signed)
Pt. Reports had some hard stools that were "very dark." Not black, just dark brown. No bright red blood noted. No abdominal symptoms. Last BP 110/60  Pulse 80. Instructed to continue to watch stool color the next 1-2 days.If color becomes black or he sees blood in stool to call back. Verbalizes understanding.

## 2017-07-17 NOTE — Telephone Encounter (Signed)
Please be advised.  °

## 2017-11-15 DIAGNOSIS — R69 Illness, unspecified: Secondary | ICD-10-CM | POA: Diagnosis not present

## 2018-02-15 ENCOUNTER — Ambulatory Visit: Payer: Medicare HMO

## 2018-02-15 ENCOUNTER — Ambulatory Visit (INDEPENDENT_AMBULATORY_CARE_PROVIDER_SITE_OTHER): Payer: Medicare HMO

## 2018-02-15 ENCOUNTER — Encounter: Payer: Self-pay | Admitting: Family Medicine

## 2018-02-15 DIAGNOSIS — Z23 Encounter for immunization: Secondary | ICD-10-CM | POA: Diagnosis not present

## 2018-02-21 DIAGNOSIS — H5212 Myopia, left eye: Secondary | ICD-10-CM | POA: Diagnosis not present

## 2018-02-21 DIAGNOSIS — H35372 Puckering of macula, left eye: Secondary | ICD-10-CM | POA: Diagnosis not present

## 2018-02-21 DIAGNOSIS — H2511 Age-related nuclear cataract, right eye: Secondary | ICD-10-CM | POA: Diagnosis not present

## 2018-05-16 DIAGNOSIS — R69 Illness, unspecified: Secondary | ICD-10-CM | POA: Diagnosis not present

## 2018-10-03 DIAGNOSIS — R69 Illness, unspecified: Secondary | ICD-10-CM | POA: Diagnosis not present

## 2019-01-07 DIAGNOSIS — R69 Illness, unspecified: Secondary | ICD-10-CM | POA: Diagnosis not present

## 2019-01-30 DIAGNOSIS — R69 Illness, unspecified: Secondary | ICD-10-CM | POA: Diagnosis not present

## 2019-02-19 ENCOUNTER — Other Ambulatory Visit: Payer: Self-pay

## 2019-02-19 ENCOUNTER — Ambulatory Visit (INDEPENDENT_AMBULATORY_CARE_PROVIDER_SITE_OTHER): Payer: Medicare HMO

## 2019-02-19 ENCOUNTER — Encounter: Payer: Self-pay | Admitting: Family Medicine

## 2019-02-19 DIAGNOSIS — Z23 Encounter for immunization: Secondary | ICD-10-CM | POA: Diagnosis not present

## 2019-02-27 DIAGNOSIS — H5213 Myopia, bilateral: Secondary | ICD-10-CM | POA: Diagnosis not present

## 2019-02-27 DIAGNOSIS — H35372 Puckering of macula, left eye: Secondary | ICD-10-CM | POA: Diagnosis not present

## 2019-02-27 DIAGNOSIS — H2511 Age-related nuclear cataract, right eye: Secondary | ICD-10-CM | POA: Diagnosis not present

## 2019-04-03 DIAGNOSIS — R69 Illness, unspecified: Secondary | ICD-10-CM | POA: Diagnosis not present

## 2019-06-29 ENCOUNTER — Ambulatory Visit: Payer: Medicare HMO

## 2019-07-04 ENCOUNTER — Other Ambulatory Visit: Payer: Self-pay

## 2019-07-04 ENCOUNTER — Ambulatory Visit: Payer: Medicare HMO | Attending: Internal Medicine

## 2019-07-04 ENCOUNTER — Ambulatory Visit (INDEPENDENT_AMBULATORY_CARE_PROVIDER_SITE_OTHER): Payer: Medicare HMO

## 2019-07-04 DIAGNOSIS — Z23 Encounter for immunization: Secondary | ICD-10-CM | POA: Insufficient documentation

## 2019-07-04 DIAGNOSIS — Z0000 Encounter for general adult medical examination without abnormal findings: Secondary | ICD-10-CM | POA: Diagnosis not present

## 2019-07-04 NOTE — Patient Instructions (Addendum)
Brian Welch , Thank you for taking time to come for your Medicare Wellness Visit. I appreciate your ongoing commitment to your health goals. Please review the following plan we discussed and let me know if I can assist you in the future.   Screening recommendations/referrals: Colorectal Screening: No longer indicated   Vision and Dental Exams: Recommended annual ophthalmology exams for early detection of glaucoma and other disorders of the eye Recommended annual dental exams for proper oral hygiene  Vaccinations: Influenza vaccine: completed 02/19/19 Pneumococcal vaccine: up to date; last 06/17/14 Tdap vaccine: up to date; last ; Declined. Please call your insurance company to determine your out of pocket expense. You may also receive this vaccine at your local pharmacy or Health Dept. Shingles vaccine: Please call your insurance company to determine your out of pocket expense for the Shingrix vaccine. You may receive this vaccine at your local pharmacy. (see attached handout)  Advanced directives: Please bring a copy of your POA (Power of Attorney) and/or Living Will to your next appointment.  Goals: Recommend to drink at least 6-8 8oz glasses of water per day and consume a balanced diet rich in fresh fruits and vegetables.   Next appointment: Please schedule your Annual Wellness Visit with your Nurse Health Advisor in one year.  Preventive Care 84 Years and Older, Male Preventive care refers to lifestyle choices and visits with your health care provider that can promote health and wellness. What does preventive care include?  A yearly physical exam. This is also called an annual well check.  Dental exams once or twice a year.  Routine eye exams. Ask your health care provider how often you should have your eyes checked.  Personal lifestyle choices, including:  Daily care of your teeth and gums.  Regular physical activity.  Eating a healthy diet.  Avoiding tobacco and drug  use.  Limiting alcohol use.  Practicing safe sex.  Taking low doses of aspirin every day if recommended by your health care provider..  Taking vitamin and mineral supplements as recommended by your health care provider. What happens during an annual well check? The services and screenings done by your health care provider during your annual well check will depend on your age, overall health, lifestyle risk factors, and family history of disease. Counseling  Your health care provider may ask you questions about your:  Alcohol use.  Tobacco use.  Drug use.  Emotional well-being.  Home and relationship well-being.  Sexual activity.  Eating habits.  History of falls.  Memory and ability to understand (cognition).  Work and work Statistician. Screening  You may have the following tests or measurements:  Height, weight, and BMI.  Blood pressure.  Lipid and cholesterol levels. These may be checked every 5 years, or more frequently if you are over 84 years old.  Skin check.  Lung cancer screening. You may have this screening every year starting at age 84 if you have a 30-pack-year history of smoking and currently smoke or have quit within the past 15 years.  Fecal occult blood test (FOBT) of the stool. You may have this test every year starting at age 84.  Flexible sigmoidoscopy or colonoscopy. You may have a sigmoidoscopy every 5 years or a colonoscopy every 10 years starting at age 84.  Prostate cancer screening. Recommendations will vary depending on your family history and other risks.  Hepatitis C blood test.  Hepatitis B blood test.  Sexually transmitted disease (STD) testing.  Diabetes screening. This is done  by checking your blood sugar (glucose) after you have not eaten for a while (fasting). You may have this done every 1-3 years.  Abdominal aortic aneurysm (AAA) screening. You may need this if you are a current or former smoker.  Osteoporosis. You may  be screened starting at age 84 if you are at high risk. Talk with your health care provider about your test results, treatment options, and if necessary, the need for more tests. Vaccines  Your health care provider may recommend certain vaccines, such as:  Influenza vaccine. This is recommended every year.  Tetanus, diphtheria, and acellular pertussis (Tdap, Td) vaccine. You may need a Td booster every 10 years.  Zoster vaccine. You may need this after age 84.  Pneumococcal 13-valent conjugate (PCV13) vaccine. One dose is recommended after age 84.  Pneumococcal polysaccharide (PPSV23) vaccine. One dose is recommended after age 84. Talk to your health care provider about which screenings and vaccines you need and how often you need them. This information is not intended to replace advice given to you by your health care provider. Make sure you discuss any questions you have with your health care provider. Document Released: 06/12/2015 Document Revised: 02/03/2016 Document Reviewed: 03/17/2015 Elsevier Interactive Patient Education  2017 [place] Prevention in the Home Falls can cause injuries. They can happen to people of all ages. There are many things you can do to make your home safe and to help prevent falls. What can I do on the outside of my home?  Regularly fix the edges of walkways and driveways and fix any cracks.  Remove anything that might make you trip as you walk through a door, such as a raised step or threshold.  Trim any bushes or trees on the path to your home.  Use bright outdoor lighting.  Clear any walking paths of anything that might make someone trip, such as rocks or tools.  Regularly check to see if handrails are loose or broken. Make sure that both sides of any steps have handrails.  Any raised decks and porches should have guardrails on the edges.  Have any leaves, snow, or ice cleared regularly.  Use sand or salt on walking paths during  winter.  Clean up any spills in your garage right away. This includes oil or grease spills. What can I do in the bathroom?  Use night lights.  Install grab bars by the toilet and in the tub and shower. Do not use towel bars as grab bars.  Use non-skid mats or decals in the tub or shower.  If you need to sit down in the shower, use a plastic, non-slip stool.  Keep the floor dry. Clean up any water that spills on the floor as soon as it happens.  Remove soap buildup in the tub or shower regularly.  Attach bath mats securely with double-sided non-slip rug tape.  Do not have throw rugs and other things on the floor that can make you trip. What can I do in the bedroom?  Use night lights.  Make sure that you have a light by your bed that is easy to reach.  Do not use any sheets or blankets that are too big for your bed. They should not hang down onto the floor.  Have a firm chair that has side arms. You can use this for support while you get dressed.  Do not have throw rugs and other things on the floor that can make you trip. What can  I do in the kitchen?  Clean up any spills right away.  Avoid walking on wet floors.  Keep items that you use a lot in easy-to-reach places.  If you need to reach something above you, use a strong step stool that has a grab bar.  Keep electrical cords out of the way.  Do not use floor polish or wax that makes floors slippery. If you must use wax, use non-skid floor wax.  Do not have throw rugs and other things on the floor that can make you trip. What can I do with my stairs?  Do not leave any items on the stairs.  Make sure that there are handrails on both sides of the stairs and use them. Fix handrails that are broken or loose. Make sure that handrails are as long as the stairways.  Check any carpeting to make sure that it is firmly attached to the stairs. Fix any carpet that is loose or worn.  Avoid having throw rugs at the top or  bottom of the stairs. If you do have throw rugs, attach them to the floor with carpet tape.  Make sure that you have a light switch at the top of the stairs and the bottom of the stairs. If you do not have them, ask someone to add them for you. What else can I do to help prevent falls?  Wear shoes that:  Do not have high heels.  Have rubber bottoms.  Are comfortable and fit you well.  Are closed at the toe. Do not wear sandals.  If you use a stepladder:  Make sure that it is fully opened. Do not climb a closed stepladder.  Make sure that both sides of the stepladder are locked into place.  Ask someone to hold it for you, if possible.  Clearly mark and make sure that you can see:  Any grab bars or handrails.  First and last steps.  Where the edge of each step is.  Use tools that help you move around (mobility aids) if they are needed. These include:  Canes.  Walkers.  Scooters.  Crutches.  Turn on the lights when you go into a dark area. Replace any light bulbs as soon as they burn out.  Set up your furniture so you have a clear path. Avoid moving your furniture around.  If any of your floors are uneven, fix them.  If there are any pets around you, be aware of where they are.  Review your medicines with your doctor. Some medicines can make you feel dizzy. This can increase your chance of falling. Ask your doctor what other things that you can do to help prevent falls. This information is not intended to replace advice given to you by your health care provider. Make sure you discuss any questions you have with your health care provider. Document Released: 03/12/2009 Document Revised: 10/22/2015 Document Reviewed: 06/20/2014 Elsevier Interactive Patient Education  2017 [employer].

## 2019-07-04 NOTE — Progress Notes (Signed)
This visit is being conducted via phone call due to the COVID-19 pandemic. This patient has given me verbal consent via phone to conduct this visit, patient states they are participating from their home address. Some vital signs may be absent or patient reported.   Patient identification: identified by name, DOB, and current address.  Location provider: [facility], Office Persons participating in the virtual visit: Brian George LPN, patient, and Dr. Garret Welch     Subjective:   Brian Welch is a 86 y.o. male who presents for Medicare Annual/Subsequent preventive examination.  Review of Systems:   Cardiac Risk Factors include: advanced age (>64men, >42 women);male gender;dyslipidemia    Objective:    Vitals: There were no vitals taken for this visit.  There is no height or weight on file to calculate BMI.  Advanced Directives 07/04/2019  Does Patient Have a Medical Advance Directive? Yes  Type of Advance Directive Living will;Healthcare Power of Attorney  Does patient want to make changes to medical advance directive? No - Patient declined  Copy of [place] in Chart? No - copy requested    Tobacco Social History   Tobacco Use  Smoking Status Former Smoker  . Packs/day: 1.00  . Years: 15.00  . Pack years: 15.00  . Types: Cigarettes  . Quit date: 09/07/1973  . Years since quitting: 45.8  Smokeless Tobacco Never Used     Counseling given: Not Answered   Clinical Intake:  Pre-visit preparation completed: Yes  Pain : No/denies pain  Diabetes: No  How often do you need to have someone help you when you read instructions, pamphlets, or other written materials from your doctor or pharmacy?: 1 - Never  Interpreter Needed?: No  Information entered by :: Brian George LPN  Past Medical History:  Diagnosis Date  . History of basal cell carcinoma 06/16/2014   2013 Left ear   . History of colonic polyps 06/16/2014   Adenoma 2010. Advised 5  year follow up.     Past Surgical History:  Procedure Laterality Date  . left eye cataract removal     Family History  Problem Relation Age of Onset  . Stroke Mother        25, nonsmoker. lived to 1  . Tuberculosis Father        died 3 months before patient born   Social History   Socioeconomic History  . Marital status: Married    Spouse name: Not on file  . Number of children: 1  . Years of education: Not on file  . Highest education level: Not on file  Occupational History  . Occupation: Retired     Comment: Fish farm manager  . Smoking status: Former Smoker    Packs/day: 1.00    Years: 15.00    Pack years: 15.00    Types: Cigarettes    Quit date: 09/07/1973    Years since quitting: 45.8  . Smokeless tobacco: Never Used  Substance and Sexual Activity  . Alcohol use: No    Alcohol/week: 0.0 standard drinks  . Drug use: No  . Sexual activity: Not on file  Other Topics Concern  . Not on file  Social History Narrative   Married (wife Brian Welch patient of Dr. Yong Channel). 1 son. No grandchildren.       Retired from Public relations account executive: exercise, see movies, plays and musicals   Social Determinants of Health   Financial Resource Strain:   .  Difficulty of Paying Living Expenses: Not on file  Food Insecurity:   . Worried About Charity fundraiser in the Last Year: Not on file  . Ran Out of Food in the Last Year: Not on file  Transportation Needs:   . Lack of Transportation (Medical): Not on file  . Lack of Transportation (Non-Medical): Not on file  Physical Activity:   . Days of Exercise per Week: Not on file  . Minutes of Exercise per Session: Not on file  Stress:   . Feeling of Stress : Not on file  Social Connections:   . Frequency of Communication with Friends and Family: Not on file  . Frequency of Social Gatherings with Friends and Family: Not on file  . Attends Religious Services: Not on file  . Active Member of Clubs or  Organizations: Not on file  . Attends Archivist Meetings: Not on file  . Marital Status: Not on file    Outpatient Encounter Medications as of 07/04/2019  Medication Sig  . Cholecalciferol (VITAMIN D-3 PO) Take by mouth.  . co-enzyme Q-10 30 MG capsule Take 30 mg by mouth daily.   . Garlic 10 MG CAPS Take by mouth.  . Multiple Vitamins-Minerals (EYE VITAMINS PO) Take by mouth.  . NON FORMULARY k-2  . Omega-3 Fatty Acids (FISH OIL) 1000 MG CPDR Take by mouth.  . RESVERATROL 100 MG CAPS Take by mouth.   No facility-administered encounter medications on file as of 07/04/2019.    Activities of Daily Living In your present state of health, do you have any difficulty performing the following activities: 07/04/2019  Hearing? N  Vision? N  Difficulty concentrating or making decisions? N  Walking or climbing stairs? N  Dressing or bathing? N  Doing errands, shopping? N  Preparing Food and eating ? N  Using the Toilet? N  In the past six months, have you accidently leaked urine? N  Do you have problems with loss of bowel control? N  Managing your Medications? N  Managing your Finances? N  Housekeeping or managing your Housekeeping? N  Some recent data might be hidden    Patient Care Team: Brian Olp, MD as PCP - General (Family Medicine)   Assessment:   This is a routine wellness examination for Brian Welch.  Exercise Activities and Dietary recommendations Current Exercise Habits: Home exercise routine, Type of exercise: walking, Time (Minutes): 30, Frequency (Times/Week): 3, Weekly Exercise (Minutes/Week): 90, Intensity: Mild  Goals   None     Fall Risk Fall Risk  07/04/2019 03/10/2017  Falls in the past year? 0 No  Number falls in past yr: 0 -  Injury with Fall? 0 -  Follow up Falls evaluation completed;Falls prevention discussed;Education provided -   Is the patient's home free of loose throw rugs in walkways, pet beds, electrical cords, etc?   yes      Grab  bars in the bathroom? yes      Handrails on the stairs?   yes      Adequate lighting?   yes  Depression Screen PHQ 2/9 Scores 07/04/2019 03/10/2017  PHQ - 2 Score 0 0    Cognitive Function     6CIT Screen 07/04/2019  What Year? 0 points  What month? 0 points  What time? 0 points  Count back from 20 0 points  Months in reverse 0 points    Immunization History  Administered Date(s) Administered  . Fluad Quad(high Dose 65+) 02/19/2019  .  Influenza Split 03/13/2012  . Influenza Whole 04/06/2007, 03/11/2008, 03/23/2009, 02/17/2010  . Influenza, High Dose Seasonal PF 02/16/2017, 02/15/2018  . Influenza,inj,Quad PF,6+ Mos 02/19/2013, 02/10/2014, 03/16/2016  . Influenza-Unspecified 02/25/2015  . PFIZER SARS-COV-2 Vaccination 07/04/2019  . Pneumococcal Conjugate-13 06/17/2014  . Pneumococcal Polysaccharide-23 02/17/2004  . Td 01/29/1999  . Tdap 10/12/2011  . Zoster 05/30/2008    Qualifies for Shingles Vaccine? Discussed and patient will check with pharmacy for coverage.  Patient education handout provided    Screening Tests Health Maintenance  Topic Date Due  . TETANUS/TDAP  10/11/2021  . INFLUENZA VACCINE  Completed  . PNA vac Low Risk Adult  Completed   Cancer Screenings: Lung: Low Dose CT Chest recommended if Age 97-80 years, 30 pack-year currently smoking OR have quit w/in 15years. Patient does not qualify. Colorectal: No longer indicated       Plan:  I have personally reviewed and addressed the Medicare Annual Wellness questionnaire and have noted the following in the patient's chart:  A. Medical and social history B. Use of alcohol, tobacco or illicit drugs  C. Current medications and supplements D. Functional ability and status E.  Nutritional status F.  Physical activity G. Advance directives H. List of other physicians I.  Hospitalizations, surgeries, and ER visits in previous 12 months J.  [place] such as hearing and vision if needed, cognitive  and depression L. Referrals, records requested, and appointments-none   In addition, I have reviewed and discussed with patient certain preventive protocols, quality metrics, and best practice recommendations. A written personalized care plan for preventive services as well as general preventive health recommendations were provided to patient.   Signed,  Brian George, LPN  Nurse Health Advisor   Nurse Notes: no additional

## 2019-07-04 NOTE — Progress Notes (Signed)
   Covid-19 Vaccination Clinic  Name:  Brian Welch    MRN: [id number] DOB: [birth year]  07/04/2019  Brian Welch was observed post Covid-19 immunization for 15 minutes without incidence. He was provided with Vaccine Information Sheet and instruction to access the V-Safe system.   Brian Welch was instructed to call 911 with any severe reactions post vaccine: Marland Kitchen Difficulty breathing  . Swelling of your face and throat  . A fast heartbeat  . A bad rash all over your body  . Dizziness and weakness    Immunizations Administered    Name Date Dose VIS Date Route   Pfizer COVID-19 Vaccine 07/04/2019 10:15 AM 0.3 mL 05/10/2019 Intramuscular   Manufacturer: [place]   Lot: [id number]   [place]: [id number]

## 2019-07-17 ENCOUNTER — Other Ambulatory Visit: Payer: Self-pay

## 2019-07-17 ENCOUNTER — Ambulatory Visit (INDEPENDENT_AMBULATORY_CARE_PROVIDER_SITE_OTHER): Payer: Medicare HMO | Admitting: Family Medicine

## 2019-07-17 ENCOUNTER — Encounter: Payer: Self-pay | Admitting: Family Medicine

## 2019-07-17 VITALS — BP 100/70 | HR 68 | Temp 98.2°F | Ht 71.0 in | Wt 166.6 lb

## 2019-07-17 DIAGNOSIS — D7589 Other specified diseases of blood and blood-forming organs: Secondary | ICD-10-CM | POA: Diagnosis not present

## 2019-07-17 DIAGNOSIS — E785 Hyperlipidemia, unspecified: Secondary | ICD-10-CM

## 2019-07-17 DIAGNOSIS — Z0000 Encounter for general adult medical examination without abnormal findings: Secondary | ICD-10-CM

## 2019-07-17 LAB — COMPREHENSIVE METABOLIC PANEL
ALT: 23 U/L (ref 0–53)
AST: 24 U/L (ref 0–37)
Albumin: 4.3 g/dL (ref 3.5–5.2)
Alkaline Phosphatase: 63 U/L (ref 39–117)
BUN: 17 mg/dL (ref 6–23)
CO2: 29 mEq/L (ref 19–32)
Calcium: 9.5 mg/dL (ref 8.4–10.5)
Chloride: 104 mEq/L (ref 96–112)
Creatinine, Ser: 1.01 mg/dL (ref 0.40–1.50)
GFR: 70.11 mL/min (ref >60.00)
Glucose, Bld: 93 mg/dL (ref 70–99)
Potassium: 4.5 mEq/L (ref 3.5–5.1)
Sodium: 139 mEq/L (ref 135–145)
Total Bilirubin: 0.7 mg/dL (ref 0.2–1.2)
Total Protein: 6.8 g/dL (ref 6.0–8.3)

## 2019-07-17 LAB — CBC WITH DIFFERENTIAL/PLATELET
Basophils Absolute: 0.1 10*3/uL (ref 0.0–0.1)
Basophils Relative: 0.7 % (ref 0.0–3.0)
Eosinophils Absolute: 0.6 10*3/uL (ref 0.0–0.7)
Eosinophils Relative: 7.3 % — ABNORMAL HIGH (ref 0.0–5.0)
HCT: 46.4 % (ref 39.0–52.0)
Hemoglobin: 15.9 g/dL (ref 13.0–17.0)
Lymphocytes Relative: 19.3 % (ref 12.0–46.0)
Lymphs Abs: 1.7 10*3/uL (ref 0.7–4.0)
MCHC: 34.3 g/dL (ref 30.0–36.0)
MCV: 99.8 fl (ref 78.0–100.0)
Monocytes Absolute: 0.9 10*3/uL (ref 0.1–1.0)
Monocytes Relative: 10 % (ref 3.0–12.0)
Neutro Abs: 5.5 10*3/uL (ref 1.4–7.7)
Neutrophils Relative %: 62.7 % (ref 43.0–77.0)
Platelets: 218 10*3/uL (ref 150.0–400.0)
RBC: 4.65 Mil/uL (ref 4.22–5.81)
RDW: 12.9 % (ref 11.5–15.5)
WBC: 8.7 10*3/uL (ref 4.0–10.5)

## 2019-07-17 LAB — LIPID PANEL
Cholesterol: 172 mg/dL (ref 0–200)
HDL: 40.3 mg/dL (ref >39.00)
LDL Cholesterol: 100 mg/dL — ABNORMAL HIGH (ref 0–99)
NonHDL: 131.9
Total CHOL/HDL Ratio: 4
Triglycerides: 162 mg/dL — ABNORMAL HIGH (ref 0.0–149.0)
VLDL: 32.4 mg/dL (ref 0.0–40.0)

## 2019-07-17 LAB — VITAMIN B12: Vitamin B-12: 522 pg/mL (ref 211–911)

## 2019-07-17 NOTE — Patient Instructions (Addendum)
Please stop by lab before you go  Great to see you today!  2 weeks after covid shot Please check with your pharmacy to see if they have the shingrix vaccine. If they do- please get this immunization and update [place] by phone call or mychart with dates you receive the vaccine  Mineral oil for ear full of wax Purchase mineral oil from laxative aisle Lay down on your side with ear that is bothering you facing up Use 3-4 drops with a dropper and place in ear for 30 seconds Place cotton swab outside of ear Turn to other side and allow this to drain Repeat 3-4 x a day Return to see [place] if not improving within a few days

## 2019-07-17 NOTE — Progress Notes (Signed)
Phone: [phone]   Subjective:  Patient presents today for their annual physical. Chief complaint-noted.   See problem oriented charting- ROS- full  review of systems was completed and negative  Including No chest pain or shortness of breath. No headache or blurry vision.   The following were reviewed and entered/updated in epic: Past Medical History:  Diagnosis Date  . History of basal cell carcinoma 06/16/2014   2013 Left ear   . History of colonic polyps 06/16/2014   Adenoma 2010. Advised 5 year follow up.     Patient Active Problem List   Diagnosis Date Noted  . Hyperlipidemia 07/17/2019  . History of colonic polyps 06/16/2014  . History of basal cell carcinoma 06/16/2014   Past Surgical History:  Procedure Laterality Date  . left eye cataract removal      Family History  Problem Relation Age of Onset  . Stroke Mother        92, nonsmoker. lived to 59  . Tuberculosis Father        died 3 months before patient born    Medications- reviewed and updated Current Outpatient Medications  Medication Sig Dispense Refill  . Cholecalciferol (VITAMIN D-3 PO) Take by mouth.    . co-enzyme Q-10 30 MG capsule Take 30 mg by mouth daily.     . Garlic 10 MG CAPS Take by mouth.    . Multiple Vitamins-Minerals (EYE VITAMINS PO) Take by mouth.    . NON FORMULARY k-2    . Omega-3 Fatty Acids (FISH OIL) 1000 MG CPDR Take by mouth.    . RESVERATROL 100 MG CAPS Take by mouth.     No current facility-administered medications for this visit.    Allergies-reviewed and updated No Known Allergies  Social History   Social History Narrative   Married (wife Arbie Cookey patient of Dr. Yong Channel). 1 son. No grandchildren.       Retired from Public relations account executive: exercise, see movies, plays and musicals   Objective  Objective:  BP 100/70   Pulse 68   Temp 98.2 F (36.8 C)   Ht 5\' 11"  (1.803 m)   Wt 166 lb 9.6 oz (75.6 kg)   SpO2 100%   BMI 23.24 kg/m  Gen: NAD,  resting comfortably HEENT: Mask not removed due to covid 19. TM normal- removed significant cerumen left ear but still some remaining- could not fully see TM. Bridge of nose normal. Eyelids normal.  Neck: no thyromegaly or cervical lymphadenopathy  CV: RRR no murmurs rubs or gallops Lungs: CTAB no crackles, wheeze, rhonchi Abdomen: soft/nontender/nondistended/normal bowel sounds. No rebound or guarding.  Ext: no edema Skin: warm, dry Neuro: grossly normal, moves all extremities, PERRLA    Assessment and Plan  86 y.o. male presenting for annual physical.  Health Maintenance counseling: 1. Anticipatory guidance: Patient counseled regarding regular dental exams yes q6 months, eye exams yes yearly,  avoiding smoking and second hand smoke yes , limiting alcohol to 0 beverages per day.   2. Risk factor reduction:  Advised patient of need for regular exercise and diet rich and fruits and vegetables to reduce risk of heart attack and stroke. Exercise- walks/push ups/ also uses 35 lb kettle bell- encouraged him to be careful. Diet-cooks at home and tries to eat healthy.  Previously patient has lost weight since 2014-thankfully weight has finally stabilized-I would not encourage further weight loss  Wt Readings from Last 3 Encounters:  07/17/19 166 lb 9.6 oz (75.6 kg)  03/10/17 159 lb 9.6 oz (72.4 kg)  06/17/14 172 lb (78 kg)  3. Immunizations/screenings/ancillary studies-pending final COVID-19 vaccination but already has a scheduled.  We also discussed Shingrix at his pharmacy at least 2 weeks after COVID-19 vaccination  Immunization History  Administered Date(s) Administered  . Fluad Quad(high Dose 65+) 02/19/2019  . Influenza Split 03/13/2012  . Influenza Whole 04/06/2007, 03/11/2008, 03/23/2009, 02/17/2010  . Influenza, High Dose Seasonal PF 02/16/2017, 02/15/2018  . Influenza,inj,Quad PF,6+ Mos 02/19/2013, 02/10/2014, 03/16/2016  . Influenza-Unspecified 02/25/2015  . PFIZER SARS-COV-2  Vaccination 07/04/2019  . Pneumococcal Conjugate-13 06/17/2014  . Pneumococcal Polysaccharide-23 02/17/2004  . Td 01/29/1999  . Tdap 10/12/2011  . Zoster 05/30/2008  4. Prostate cancer screening- patient is past age based screening recommendations 5. Colon cancer screening - referred back for colonoscopy in 2018 due to history of adenomatous colon polyp with advised repeat in 2016-.  Patient was not able to be reached by [facility]-see letter April 13, 2017. Patient is now age 11- he wants to decline any further colonoscopy unless blood in stools or anemia.   6. Skin cancer screening- offered derm referral as has not had check in long time- he wants to reconsider next visit. advised regular sunscreen use. Denies worrisome, changing, or new skin lesions.  7. former smoker-quit smoking in 1970s-no regular screening required  Status of chronic or acute concerns   Patient with mild hyperlipidemia-at his age would not suggest statin for primary prevention.  Macrocytosis- check b12, folate, methylmalonic acid Lab Results  Component Value Date   WBC 7.1 03/10/2017   HGB 16.9 03/10/2017   HCT 50.3 03/10/2017   MCV 100.6 (H) 03/10/2017   PLT 246.0 03/10/2017   Recommended follow up: Physical in 1 year Future Appointments  Date Time Provider [place]  07/17/2019  9:20 AM [name], MD [facility]  07/29/2019  2:45 PM [place] [facility]   Lab/Order associations: fasting   ICD-10-CM   1. Preventative health care  Z00.00 CBC with Differential/Platelet    Comprehensive metabolic panel    Lipid panel  2. Hyperlipidemia, unspecified hyperlipidemia type  E78.5 CBC with Differential/Platelet    Comprehensive metabolic panel    Lipid panel    No orders of the defined types were placed in this encounter.   Return precautions advised.  Garret Reddish, MD

## 2019-07-20 LAB — METHYLMALONIC ACID, SERUM: Methylmalonic Acid, Quant: 95 nmol/L (ref 87–318)

## 2019-07-20 LAB — FOLATE RBC: RBC Folate: 465 ng/mL RBC (ref >280)

## 2019-07-29 ENCOUNTER — Ambulatory Visit: Payer: Medicare HMO | Attending: Internal Medicine

## 2019-07-29 ENCOUNTER — Other Ambulatory Visit: Payer: Self-pay

## 2019-07-29 DIAGNOSIS — Z23 Encounter for immunization: Secondary | ICD-10-CM | POA: Insufficient documentation

## 2019-07-29 NOTE — Progress Notes (Signed)
   Covid-19 Vaccination Clinic  Name:  Brian Welch    MRN: [id number] DOB: [birth year]  07/29/2019  Brian Welch was observed post Covid-19 immunization for 15 minutes without incidence. He was provided with Vaccine Information Sheet and instruction to access the V-Safe system.   Brian Welch was instructed to call 911 with any severe reactions post vaccine: Marland Kitchen Difficulty breathing  . Swelling of your face and throat  . A fast heartbeat  . A bad rash all over your body  . Dizziness and weakness    Immunizations Administered    Name Date Dose VIS Date Route   Pfizer COVID-19 Vaccine 07/29/2019  2:45 PM 0.3 mL 05/10/2019 Intramuscular   Manufacturer: [place]   Lot: [id number]   [place]: [id number]

## 2019-12-16 ENCOUNTER — Emergency Department (HOSPITAL_COMMUNITY): Payer: Medicare HMO

## 2019-12-16 ENCOUNTER — Inpatient Hospital Stay (HOSPITAL_COMMUNITY)
Admission: EM | Admit: 2019-12-16 | Discharge: 2019-12-19 | DRG: 378 | Disposition: A | Discharge status: Home / Self Care | Payer: Medicare HMO | Attending: Internal Medicine | Admitting: Internal Medicine

## 2019-12-16 ENCOUNTER — Other Ambulatory Visit: Payer: Self-pay

## 2019-12-16 DIAGNOSIS — Z79899 Other long term (current) drug therapy: Secondary | ICD-10-CM

## 2019-12-16 DIAGNOSIS — D72829 Elevated white blood cell count, unspecified: Secondary | ICD-10-CM | POA: Diagnosis present

## 2019-12-16 DIAGNOSIS — K5900 Constipation, unspecified: Secondary | ICD-10-CM | POA: Diagnosis present

## 2019-12-16 DIAGNOSIS — D7589 Other specified diseases of blood and blood-forming organs: Secondary | ICD-10-CM | POA: Diagnosis present

## 2019-12-16 DIAGNOSIS — E860 Dehydration: Secondary | ICD-10-CM | POA: Diagnosis not present

## 2019-12-16 DIAGNOSIS — S0990XA Unspecified injury of head, initial encounter: Secondary | ICD-10-CM | POA: Diagnosis not present

## 2019-12-16 DIAGNOSIS — I959 Hypotension, unspecified: Secondary | ICD-10-CM | POA: Diagnosis not present

## 2019-12-16 DIAGNOSIS — I7090 Unspecified atherosclerosis: Secondary | ICD-10-CM | POA: Diagnosis not present

## 2019-12-16 DIAGNOSIS — Z87891 Personal history of nicotine dependence: Secondary | ICD-10-CM

## 2019-12-16 DIAGNOSIS — R112 Nausea with vomiting, unspecified: Secondary | ICD-10-CM

## 2019-12-16 DIAGNOSIS — R55 Syncope and collapse: Secondary | ICD-10-CM | POA: Diagnosis not present

## 2019-12-16 DIAGNOSIS — Z85828 Personal history of other malignant neoplasm of skin: Secondary | ICD-10-CM

## 2019-12-16 DIAGNOSIS — K269 Duodenal ulcer, unspecified as acute or chronic, without hemorrhage or perforation: Secondary | ICD-10-CM

## 2019-12-16 DIAGNOSIS — S0003XA Contusion of scalp, initial encounter: Secondary | ICD-10-CM | POA: Diagnosis present

## 2019-12-16 DIAGNOSIS — K209 Esophagitis, unspecified without bleeding: Secondary | ICD-10-CM | POA: Diagnosis present

## 2019-12-16 DIAGNOSIS — I951 Orthostatic hypotension: Secondary | ICD-10-CM | POA: Diagnosis present

## 2019-12-16 DIAGNOSIS — K264 Chronic or unspecified duodenal ulcer with hemorrhage: Secondary | ICD-10-CM | POA: Diagnosis not present

## 2019-12-16 DIAGNOSIS — R944 Abnormal results of kidney function studies: Secondary | ICD-10-CM | POA: Diagnosis present

## 2019-12-16 DIAGNOSIS — R1111 Vomiting without nausea: Secondary | ICD-10-CM | POA: Diagnosis not present

## 2019-12-16 DIAGNOSIS — K449 Diaphragmatic hernia without obstruction or gangrene: Secondary | ICD-10-CM | POA: Diagnosis present

## 2019-12-16 DIAGNOSIS — R739 Hyperglycemia, unspecified: Secondary | ICD-10-CM | POA: Diagnosis present

## 2019-12-16 DIAGNOSIS — D62 Acute posthemorrhagic anemia: Secondary | ICD-10-CM | POA: Diagnosis present

## 2019-12-16 DIAGNOSIS — R519 Headache, unspecified: Secondary | ICD-10-CM | POA: Diagnosis not present

## 2019-12-16 DIAGNOSIS — E861 Hypovolemia: Secondary | ICD-10-CM | POA: Diagnosis present

## 2019-12-16 DIAGNOSIS — Z8719 Personal history of other diseases of the digestive system: Secondary | ICD-10-CM

## 2019-12-16 DIAGNOSIS — R231 Pallor: Secondary | ICD-10-CM | POA: Diagnosis not present

## 2019-12-16 DIAGNOSIS — Z831 Family history of other infectious and parasitic diseases: Secondary | ICD-10-CM

## 2019-12-16 DIAGNOSIS — Z20822 Contact with and (suspected) exposure to covid-19: Secondary | ICD-10-CM | POA: Diagnosis present

## 2019-12-16 DIAGNOSIS — E876 Hypokalemia: Secondary | ICD-10-CM | POA: Diagnosis not present

## 2019-12-16 DIAGNOSIS — K222 Esophageal obstruction: Secondary | ICD-10-CM | POA: Diagnosis present

## 2019-12-16 DIAGNOSIS — R42 Dizziness and giddiness: Secondary | ICD-10-CM | POA: Diagnosis not present

## 2019-12-16 DIAGNOSIS — Z823 Family history of stroke: Secondary | ICD-10-CM

## 2019-12-16 DIAGNOSIS — R1110 Vomiting, unspecified: Secondary | ICD-10-CM | POA: Diagnosis not present

## 2019-12-16 DIAGNOSIS — R410 Disorientation, unspecified: Secondary | ICD-10-CM | POA: Diagnosis not present

## 2019-12-16 LAB — ABO/RH: ABO/RH(D): A POS

## 2019-12-16 LAB — CK: Total CK: 90 U/L (ref 49–397)

## 2019-12-16 LAB — COMPREHENSIVE METABOLIC PANEL
ALT: 14 U/L (ref 0–44)
AST: 19 U/L (ref 15–41)
Albumin: 3.3 g/dL — ABNORMAL LOW (ref 3.5–5.0)
Alkaline Phosphatase: 43 U/L (ref 38–126)
Anion gap: 10 (ref 5–15)
BUN: 30 mg/dL — ABNORMAL HIGH (ref 8–23)
CO2: 23 mmol/L (ref 22–32)
Calcium: 8.3 mg/dL — ABNORMAL LOW (ref 8.9–10.3)
Chloride: 107 mmol/L (ref 98–111)
Creatinine, Ser: 0.95 mg/dL (ref 0.61–1.24)
GFR calc Af Amer: >60 mL/min (ref >60)
GFR calc non Af Amer: >60 mL/min (ref >60)
Glucose, Bld: 193 mg/dL — ABNORMAL HIGH (ref 70–99)
Potassium: 3.7 mmol/L (ref 3.5–5.1)
Sodium: 140 mmol/L (ref 135–145)
Total Bilirubin: 1.3 mg/dL — ABNORMAL HIGH (ref 0.3–1.2)
Total Protein: 5.7 g/dL — ABNORMAL LOW (ref 6.5–8.1)

## 2019-12-16 LAB — URINALYSIS, ROUTINE W REFLEX MICROSCOPIC
Bilirubin Urine: NEGATIVE
Glucose, UA: NEGATIVE mg/dL
Hgb urine dipstick: NEGATIVE
Ketones, ur: 20 mg/dL — AB
Leukocytes,Ua: NEGATIVE
Nitrite: NEGATIVE
Protein, ur: NEGATIVE mg/dL
Specific Gravity, Urine: 1.021 (ref 1.005–1.030)
pH: 5 (ref 5.0–8.0)

## 2019-12-16 LAB — CBC WITH DIFFERENTIAL/PLATELET
Abs Immature Granulocytes: 0.09 10*3/uL — ABNORMAL HIGH (ref 0.00–0.07)
Basophils Absolute: 0.1 10*3/uL (ref 0.0–0.1)
Basophils Relative: 0 %
Eosinophils Absolute: 0.1 10*3/uL (ref 0.0–0.5)
Eosinophils Relative: 0 %
HCT: 40 % (ref 39.0–52.0)
Hemoglobin: 13 g/dL (ref 13.0–17.0)
Immature Granulocytes: 1 %
Lymphocytes Relative: 14 %
Lymphs Abs: 1.9 10*3/uL (ref 0.7–4.0)
MCH: 33.7 pg (ref 26.0–34.0)
MCHC: 32.5 g/dL (ref 30.0–36.0)
MCV: 103.6 fL — ABNORMAL HIGH (ref 80.0–100.0)
Monocytes Absolute: 0.8 10*3/uL (ref 0.1–1.0)
Monocytes Relative: 5 %
Neutro Abs: 11.1 10*3/uL — ABNORMAL HIGH (ref 1.7–7.7)
Neutrophils Relative %: 80 %
Platelets: 209 10*3/uL (ref 150–400)
RBC: 3.86 MIL/uL — ABNORMAL LOW (ref 4.22–5.81)
RDW: 12.9 % (ref 11.5–15.5)
WBC: 14 10*3/uL — ABNORMAL HIGH (ref 4.0–10.5)
nRBC: 0 % (ref 0.0–0.2)

## 2019-12-16 LAB — POC OCCULT BLOOD, ED: Fecal Occult Bld: NEGATIVE

## 2019-12-16 LAB — ETHANOL: Alcohol, Ethyl (B): <10 mg/dL (ref <10)

## 2019-12-16 LAB — OCCULT BLOOD X 1 CARD TO LAB, STOOL: Fecal Occult Bld: POSITIVE — AB

## 2019-12-16 LAB — TYPE AND SCREEN
ABO/RH(D): A POS
Antibody Screen: NEGATIVE

## 2019-12-16 LAB — MAGNESIUM: Magnesium: 2 mg/dL (ref 1.7–2.4)

## 2019-12-16 LAB — LIPASE, BLOOD: Lipase: 35 U/L (ref 11–51)

## 2019-12-16 LAB — TROPONIN I (HIGH SENSITIVITY): Troponin I (High Sensitivity): 5 ng/L (ref <18)

## 2019-12-16 LAB — PROTIME-INR
INR: 1.1 (ref 0.8–1.2)
Prothrombin Time: 13.7 seconds (ref 11.4–15.2)

## 2019-12-16 LAB — SARS CORONAVIRUS 2 BY RT PCR (HOSPITAL ORDER, PERFORMED IN ~~LOC~~ HOSPITAL LAB): SARS Coronavirus 2: NEGATIVE

## 2019-12-16 LAB — CBG MONITORING, ED: Glucose-Capillary: 188 mg/dL — ABNORMAL HIGH (ref 70–99)

## 2019-12-16 MED ORDER — ONDANSETRON HCL 4 MG/2ML IJ SOLN
4.0000 mg | Freq: Once | INTRAMUSCULAR | Status: AC
  Administered 2019-12-16: 4 mg via INTRAVENOUS
  Filled 2019-12-16: qty 2

## 2019-12-16 MED ORDER — ACETAMINOPHEN 325 MG PO TABS: 650.0000 mg | ORAL_TABLET | Freq: Four times a day (QID) | ORAL | Status: DC | PRN

## 2019-12-16 MED ORDER — SODIUM CHLORIDE 0.9 % IV BOLUS (SEPSIS)
1000.0000 mL | Freq: Once | INTRAVENOUS | Status: AC
  Administered 2019-12-16: 1000 mL via INTRAVENOUS

## 2019-12-16 MED ORDER — SODIUM CHLORIDE 0.9 % IV SOLN: INTRAVENOUS | Status: DC

## 2019-12-16 MED ORDER — ACETAMINOPHEN 650 MG RE SUPP: 650.0000 mg | Freq: Four times a day (QID) | RECTAL | Status: DC | PRN

## 2019-12-16 MED ORDER — ONDANSETRON HCL 4 MG/2ML IJ SOLN: 4.0000 mg | Freq: Four times a day (QID) | INTRAMUSCULAR | Status: DC | PRN

## 2019-12-16 MED ORDER — ONDANSETRON HCL 4 MG PO TABS: 4.0000 mg | ORAL_TABLET | Freq: Four times a day (QID) | ORAL | Status: DC | PRN

## 2019-12-16 MED ORDER — ENOXAPARIN SODIUM 40 MG/0.4ML ~~LOC~~ SOLN: 40.0000 mg | SUBCUTANEOUS | Status: DC

## 2019-12-16 MED ORDER — ONDANSETRON 4 MG PO TBDP: 4.0000 mg | ORAL_TABLET | Freq: Four times a day (QID) | ORAL | 0 refills | Status: DC | PRN

## 2019-12-16 NOTE — Progress Notes (Signed)
Brian Welch just had a BM, black with some red blood from the stool in the toilet water. no frank bleeding noted. pt states he's been eating beets past several days and that changes his stools. Message sent to Dr Cruzita Lederer to ask if he wanted to order a hemocult and send to lab.

## 2019-12-16 NOTE — Progress Notes (Signed)
Stool was sent for occult blood as ordered, results have come back positive.  Message sent to Dr Cruzita Lederer, also messaged him that pt has lovenox ordered, was not given in ER, and this RN did not give after seeing the black/bloody stool.  Inquired if Dr wants to d/c the lovenox order.

## 2019-12-16 NOTE — ED Notes (Signed)
[facility] is currently out of stock of Gastric Occult Cards. Gastric Content has been sent down to [facility]. Secretary is working on [employer] to [employer]. If obtained, RN will need to go the the [facility] to apply sample to Card.

## 2019-12-16 NOTE — H&P (Signed)
History and Physical    Brian Welch [id number] DOB: [birth year] DOA: 12/16/2019  I have briefly reviewed the patient's prior medical records in [place]  PCP: [name], MD  Patient coming from: home  Chief Complaint: Nausea, presyncope  HPI: Brian Welch is a 86 y.o. male fairly healthy male without significant medical problems and on no chronic medications, independent, presents to the hospital with complaints of presyncopal episode.  Patient tells me that he woke up around 3 AM, felt very weak, lightheaded, went to the bathroom and also reported nausea.  He felt so weak that was about to pass out and guided himself to the floor, denies any loss of consciousness.  He overall had two episodes of vomiting.  He denies any chest pain, denies any fever or chills.  No abdominal pain, no nausea currently.  Patient tells me that he has been working out in his vegetable yard for the past couple of days, does not think he ate and drank enough.  He also got onto his bicycle and distributed some of his vegetables to the neighbors.  ED Course: In the emergency room he is found to be orthostatic, he is afebrile, has received 2 L of normal saline and still symptomatic and hypotensive upon standing.  His blood work shows a leukocytosis of 14 K, BUN 30 creatinine 0.95.  His urinalysis shows ketones but no evidence of infection.  Due to persistent orthostasis following 2 L of normal saline we are asked to admit  Review of Systems: All systems reviewed, and apart from HPI, all negative  Past Medical History:  Diagnosis Date  . History of basal cell carcinoma 06/16/2014   2013 Left ear   . History of colonic polyps 06/16/2014   Adenoma 2010. Advised 5 year follow up.      Past Surgical History:  Procedure Laterality Date  . left eye cataract removal       reports that he quit smoking about 46 years ago. His smoking use included cigarettes. He has a 15.00 pack-year smoking history.  He has never used smokeless tobacco. He reports that he does not drink alcohol and does not use drugs.  No Known Allergies  Family History  Problem Relation Age of Onset  . Stroke Mother        47, nonsmoker. lived to 72  . Tuberculosis Father        died 3 months before patient born    Prior to Admission medications   Medication Sig Start Date End Date Taking? Authorizing Provider  Cholecalciferol (VITAMIN D-3 PO) Take 1 tablet by mouth daily.    Yes [provider]  co-enzyme Q-10 30 MG capsule Take 30 mg by mouth daily.    Yes [provider]  Garlic 10 MG CAPS Take 1 capsule by mouth daily.    Yes [provider]  Multiple Vitamins-Minerals (EYE VITAMINS PO) Take 1 tablet by mouth daily.    Yes [provider]  Omega-3 Fatty Acids (FISH OIL) 1000 MG CPDR Take 1 capsule by mouth daily.    Yes [provider]  RESVERATROL 100 MG CAPS Take 1 capsule by mouth daily.    Yes [provider]  ondansetron (ZOFRAN ODT) 4 MG disintegrating tablet Take 1 tablet (4 mg total) by mouth every 6 (six) hours as needed. 12/16/19   Ward, Delice Bison, DO    Physical Exam: Vitals:   12/16/19 0800 12/16/19 0815 12/16/19 0832 12/16/19 0900  BP: 108/66 (!) 91/57 116/66 106/63  Pulse: 74 76 75 72  Resp: 14 17 16 18   Temp:      TempSrc:      SpO2: 98% 97% 100% 97%  Weight:      Height:          Constitutional: NAD, calm, comfortable Eyes: PERRL, lids and conjunctivae normal ENMT: Mucous membranes are dry Neck: normal, supple Respiratory: clear to auscultation bilaterally, no wheezing, no crackles. Normal respiratory effort. No accessory muscle use.  Cardiovascular: Regular rate and rhythm, no murmurs / rubs / gallops. No extremity edema. 2+ pedal pulses.  Abdomen: no tenderness, no masses palpated. Bowel sounds positive.  Musculoskeletal: no clubbing / cyanosis. Normal muscle tone.  Skin: no rashes, lesions, ulcers. No induration Neurologic:  CN 2-12 grossly intact. Strength 5/5 in all 4.  Psychiatric: Normal judgment and insight. Alert and oriented x 3. Normal mood.   Labs on Admission: I have personally reviewed following labs and imaging studies  CBC: Recent Labs  Lab 12/16/19 0552  WBC 14.0*  NEUTROABS 11.1*  HGB 13.0  HCT 40.0  MCV 103.6*  PLT 338   Basic Metabolic Panel: Recent Labs  Lab 12/16/19 0552  NA 140  K 3.7  CL 107  CO2 23  GLUCOSE 193*  BUN 30*  CREATININE 0.95  CALCIUM 8.3*  MG 2.0   Liver Function Tests: Recent Labs  Lab 12/16/19 0552  AST 19  ALT 14  ALKPHOS 43  BILITOT 1.3*  PROT 5.7*  ALBUMIN 3.3*   Coagulation Profile: Recent Labs  Lab 12/16/19 0552  INR 1.1   BNP (last 3 results) No results for input(s): PROBNP in the last 8760 hours. CBG: Recent Labs  Lab 12/16/19 0609  GLUCAP 188*   Thyroid Function Tests: No results for input(s): TSH, T4TOTAL, FREET4, T3FREE, THYROIDAB in the last 72 hours. Urine analysis:    Component Value Date/Time   COLORURINE YELLOW 12/16/2019 0840   APPEARANCEUR CLEAR 12/16/2019 0840   LABSPEC 1.021 12/16/2019 0840   PHURINE 5.0 12/16/2019 0840   GLUCOSEU NEGATIVE 12/16/2019 0840   HGBUR NEGATIVE 12/16/2019 0840   BILIRUBINUR NEGATIVE 12/16/2019 0840   BILIRUBINUR Negative 03/10/2017 1210   KETONESUR 20 (A) 12/16/2019 0840   PROTEINUR NEGATIVE 12/16/2019 0840   UROBILINOGEN 0.2 03/10/2017 1210   NITRITE NEGATIVE 12/16/2019 0840   LEUKOCYTESUR NEGATIVE 12/16/2019 0840     Radiological Exams on Admission: CT Head Wo Contrast  Result Date: 12/16/2019 CLINICAL DATA:  Posttraumatic headache.  Emesis. EXAM: CT HEAD WITHOUT CONTRAST TECHNIQUE: Contiguous axial images were obtained from the base of the skull through the vertex without intravenous contrast. COMPARISON:  None. FINDINGS: Brain: There is no evidence of acute infarct, intracranial hemorrhage, mass, midline shift, or extra-axial fluid collection. Cerebral atrophy is within  normal limits for age. Scattered hypodensities in the cerebral white matter bilaterally are nonspecific but compatible with mild chronic small vessel ischemic disease. Vascular: Calcified atherosclerosis at the skull base. No hyperdense vessel. Skull: No fracture or suspicious osseous lesion. Sinuses/Orbits: Small volume fluid in the left sphenoid sinus. Clear mastoid air cells. Left cataract extraction. Other: None. IMPRESSION: 1. No evidence of acute intracranial abnormality. 2. Mild chronic small vessel ischemic disease. Electronically Signed   By: Logan Bores M.D.   On: 12/16/2019 06:30   DG Chest Portable 1 View  Result Date: 12/16/2019 CLINICAL DATA:  Syncopal episode. EXAM: PORTABLE CHEST 1 VIEW COMPARISON:  None. FINDINGS: The cardiac silhouette, mediastinal and hilar contours are  normal for age. There is mild tortuosity and calcification of the thoracic aorta. Chronic appearing basilar scarring changes. No definite infiltrates or effusions. No pneumothorax. The bony thorax is intact. IMPRESSION: Chronic appearing basilar scarring changes. No acute pulmonary findings. Electronically Signed   By: Marijo Sanes M.D.   On: 12/16/2019 06:41    EKG: Independently reviewed.  Sinus rhythm  Assessment/Plan  Principal Problem Presyncope, orthostatic hypotension-this is likely in the setting of patient working out in the yard in hot weather for the past couple of days and becoming dehydrated.  Received 2 L of normal saline in the ED, still symptomatic and hypotensive upon standing.  Will place on IV fluids overnight at 100 cc/h which should give him about 2 L by tomorrow morning.  Recheck orthostatics in a.m., if resolved anticipate he can go home.  Will monitor on telemetry however there are no reported chest pain/shortness of breath or any other things I would indicate cardiac etiology. He has no focal deficits  Leukocytosis-probably reactive, clinically no evidence of infection, he is afebrile and  urinalysis is clear.  Chest x-ray without acute pulmonary findings.  DVT prophylaxis: Lovenox  Code Status: Full code  Family Communication: wife at bedside  Disposition Plan: home in am  Bed Type: telemetry, medical Consults called: none  Obs/Inp: obs   Marzetta Board, MD, PhD [facility]  Contact via [email or web address]  12/16/2019, 10:38 AM

## 2019-12-16 NOTE — ED Triage Notes (Addendum)
Pt. Arrived [facility] from home c/o falling after working in his yard all day. Per EMS, pt. Was diaphoretic, pale, and vomited x2. EMS noted hematoma and abrasion above L eye. Pt. doesn't remember fall. Denies blood thinners usage,  c/p, spinal pain, SOB, and diarrhea

## 2019-12-16 NOTE — ED Provider Notes (Signed)
TIME SEEN: 6:14 AM  CHIEF COMPLAINT: Fall  HPI: Patient is an 86 year old male with no significant past medical history who presents emergency department [facility] EMS from home after he had a fall versus syncopal event at home.  States that yesterday he was working in the yard all day pulling weeds and picking tomatoes.  States he was in his normal state of health.  States he woke up tonight feeling very hot.  Got up to go to the bathroom and wife reports she heard him fall.  He does not remember this.  It is unclear if this was mechanical in nature he had a syncopal event.  He denies being on blood thinners.  Has hematoma to the back of the head.  EMS reports on their arrival his blood pressure was in the 98J systolic.  They gave him 300 mL of IV fluids and blood pressures normalized.  He did have multiple episodes of brown emesis without frank blood or bile.  He does not look like coffee grounds.  He denies any chest pain, tightness or pressure, shortness of breath.  He denies headache.  No numbness, tingling or weakness.  No abdominal pain.  No diarrhea, bloody stools or melena.  Patient denies any recent infectious symptoms such as fevers, cough.  He has had both COVID-19 vaccinations.  Blood sugar with EMS was normal.   ROS: See HPI Constitutional: no fever  Eyes: no drainage  ENT: no runny nose   Cardiovascular:  no chest pain  Resp: no SOB  GI:  vomiting GU: no dysuria Integumentary: no rash  Allergy: no hives  Musculoskeletal: no leg swelling  Neurological: no slurred speech ROS otherwise negative  PAST MEDICAL HISTORY/PAST SURGICAL HISTORY:  Past Medical History:  Diagnosis Date  . History of basal cell carcinoma 06/16/2014   2013 Left ear   . History of colonic polyps 06/16/2014   Adenoma 2010. Advised 5 year follow up.      MEDICATIONS:  Prior to Admission medications   Medication Sig Start Date End Date Taking? Authorizing Provider  Cholecalciferol (VITAMIN D-3 PO)  Take by mouth.    [provider]  co-enzyme Q-10 30 MG capsule Take 30 mg by mouth daily.     [provider]  Garlic 10 MG CAPS Take by mouth.    [provider]  Multiple Vitamins-Minerals (EYE VITAMINS PO) Take by mouth.    [provider]  NON FORMULARY k-2    [provider]  Omega-3 Fatty Acids (FISH OIL) 1000 MG CPDR Take by mouth.    [provider]  RESVERATROL 100 MG CAPS Take by mouth.    [provider]    ALLERGIES:  No Known Allergies  SOCIAL HISTORY:  Social History   Tobacco Use  . Smoking status: Former Smoker    Packs/day: 1.00    Years: 15.00    Pack years: 15.00    Types: Cigarettes    Quit date: 09/07/1973    Years since quitting: 46.3  . Smokeless tobacco: Never Used  Substance Use Topics  . Alcohol use: No    Alcohol/week: 0.0 standard drinks    FAMILY HISTORY: Family History  Problem Relation Age of Onset  . Stroke Mother        55, nonsmoker. lived to 66  . Tuberculosis Father        died 3 months before patient born    EXAM: BP 101/70 (BP Location: Right Arm)   Pulse 78  Temp (!) 97.2 F (36.2 C) (Rectal)   Resp 14   Ht 5\' 11"  (1.803 m)   Wt 70.3 kg   SpO2 99%   BMI 21.62 kg/m  CONSTITUTIONAL: Alert and oriented person, place and year and responds appropriately to questions. Well-appearing; well-nourished; GCS 15, elderly, no distress HEAD: Normocephalic; small hematoma to the back of the head but no laceration EYES: Conjunctivae clear, PERRL, EOMI ENT: normal nose; small amount of blood at bilateral nares, no rhinorrhea; moist mucous membranes; pharynx without lesions noted; no dental injury; no septal hematoma NECK: Supple, no meningismus, no LAD; no midline spinal tenderness, step-off or deformity; trachea midline, cervical collar removed at bedside CARD: RRR; S1 and S2 appreciated; no murmurs, no clicks, no rubs, no gallops RESP: Normal chest excursion without splinting  or tachypnea; breath sounds clear and equal bilaterally; no wheezes, no rhonchi, no rales; no hypoxia or respiratory distress CHEST:  chest wall stable, no crepitus or ecchymosis or deformity, nontender to palpation; no flail chest ABD/GI: Normal bowel sounds; non-distended; soft, non-tender, no rebound, no guarding; no ecchymosis or other lesions noted RECTAL:  Normal rectal tone, no gross blood or melena, guaiac negative, no hemorrhoids appreciated, nontender rectal exam, no fecal impaction PELVIS:  stable, nontender to palpation BACK:  The back appears normal and is non-tender to palpation, there is no CVA tenderness; no midline spinal tenderness, step-off or deformity EXT: Normal ROM in all joints; non-tender to palpation; no edema; normal capillary refill; no cyanosis, no bony tenderness or bony deformity of patient's extremities, no joint effusion, compartments are soft, extremities are warm and well-perfused, no ecchymosis SKIN: Normal color for age and race; warm NEURO: Moves all extremities equally, no drift, sensation to light touch intact diffusely, cranial nerves II through XII intact, normal speech PSYCH: The patient's mood and manner are appropriate. Grooming and personal hygiene are appropriate.  MEDICAL DECISION MAKING: Patient here with fall versus syncopal event.  Reports working in the yard all day yesterday.  Was hypotensive with EMS but fluid responsive.  Rectal temp here is 97.2.  He denies any recent infectious symptoms.  Low suspicion for sepsis.  He denies any preceding chest pain or shortness of breath.  His EKG today shows no arrhythmia, ischemia or interval abnormality.  He does report feeling very hot prior to this happening and was outside in the yard all day yesterday in the heat.  Will check CK, electrolytes.  Will hydrate patient and give Zofran.  EMS was concerned that his vomit was dark brown in nature and they were concerned about a GI bleed.  Will send gastric occult  but the vomit here does not look like coffee-ground emesis.  He is guaiac negative on his rectal exam.  He is not on blood thinners.  Abdominal exam benign.  Episode may have been vasovagal in nature versus dehydration.  Low suspicion for ACS, PE or dissection.  At this time it does not appear that he is having an acute GI bleed.  ED PROGRESS: Patient's orthostatic vital signs are positive.  Will give second liter of IV fluids.  Labs show leukocytosis of 14,000 which may be reactive.  Normal hemoglobin.  Blood glucose normal.  Normal electrolytes.  Normal LFTs, lipase.  Negative troponin.  Normal CK.  Chest x-ray shows normal cardiac silhouette and no widened mediastinum or other acute abnormality.  Head CT also unremarkable.  No skull fracture or intracranial hemorrhage.  Urinalysis pending.  Patient will need to be reassessed after second  liter of IV fluids.  Will p.o. challenge after Zofran.  Signed out to day team.   I reviewed all nursing notes and pertinent previous records as available.  I have reviewed and interpreted any EKGs, lab and urine results, imaging (as available).    EKG Interpretation  Date/Time:  Monday December 16 2019 05:58:17 EDT Ventricular Rate:  78 PR Interval:    QRS Duration: 90 QT Interval:  394 QTC Calculation: 449 R Axis:   44 Text Interpretation: Sinus rhythm No old tracing to compare Confirmed by Deriyah Kunath, Cyril Mourning [phone]) on 12/16/2019 6:15:13 AM        CRITICAL CARE Performed by: Cyril Mourning Siedah Sedor   Total critical care time: 45 minutes  Critical care time was exclusive of separately billable procedures and treating other patients.  Critical care was necessary to treat or prevent imminent or life-threatening deterioration.  Critical care was time spent personally by me on the following activities: development of treatment plan with patient and/or surrogate as well as nursing, discussions with consultants, evaluation of patient's response to treatment,  examination of patient, obtaining history from patient or surrogate, ordering and performing treatments and interventions, ordering and review of laboratory studies, ordering and review of radiographic studies, pulse oximetry and re-evaluation of patient's condition.  MANASES ETCHISON was evaluated in Emergency Department on 12/16/2019 for the symptoms described in the history of present illness. He was evaluated in the context of the global COVID-19 pandemic, which necessitated consideration that the patient might be at risk for infection with the SARS-CoV-2 virus that causes COVID-19. Institutional protocols and algorithms that pertain to the evaluation of patients at risk for COVID-19 are in a state of rapid change based on information released by regulatory bodies including the CDC and federal and state organizations. These policies and algorithms were followed during the patient's care in the ED.       Maneh Sieben, Delice Bison, DO 12/16/19 0700

## 2019-12-16 NOTE — ED Notes (Signed)
Lunch Trays Ordered @ 1101. 

## 2019-12-16 NOTE — ED Provider Notes (Signed)
10:06 AM Patient awake and alert However, after 2 L fluid resuscitation, he remains orthostatic with pressure 80/50 with upright positioning, lightheadedness, near syncope, with attempts at motion.  Patient's findings reviewed, discussed with his wife. He has ketonuria, leukocytosis, and in the context of episode of syncope, nausea, vomiting, persistent vital sign abnormalities will be admitted for further monitoring, management.   Carmin Muskrat, MD 12/16/19 1006

## 2019-12-16 NOTE — ED Notes (Signed)
Assisted pt with using urinal and getting settled in room.

## 2019-12-16 NOTE — ED Notes (Signed)
Pt aware urine needed.  

## 2019-12-17 ENCOUNTER — Encounter (HOSPITAL_COMMUNITY): Payer: Self-pay | Admitting: Internal Medicine

## 2019-12-17 ENCOUNTER — Encounter (HOSPITAL_COMMUNITY)
Admission: EM | Disposition: A | Discharge status: Home / Self Care | Payer: Self-pay | Source: Home / Self Care | Attending: Internal Medicine

## 2019-12-17 DIAGNOSIS — R195 Other fecal abnormalities: Secondary | ICD-10-CM | POA: Diagnosis not present

## 2019-12-17 DIAGNOSIS — K921 Melena: Secondary | ICD-10-CM | POA: Diagnosis not present

## 2019-12-17 DIAGNOSIS — K449 Diaphragmatic hernia without obstruction or gangrene: Secondary | ICD-10-CM | POA: Diagnosis present

## 2019-12-17 DIAGNOSIS — D62 Acute posthemorrhagic anemia: Secondary | ICD-10-CM | POA: Diagnosis not present

## 2019-12-17 DIAGNOSIS — Z20822 Contact with and (suspected) exposure to covid-19: Secondary | ICD-10-CM | POA: Diagnosis not present

## 2019-12-17 DIAGNOSIS — D7589 Other specified diseases of blood and blood-forming organs: Secondary | ICD-10-CM | POA: Diagnosis present

## 2019-12-17 DIAGNOSIS — E876 Hypokalemia: Secondary | ICD-10-CM | POA: Diagnosis not present

## 2019-12-17 DIAGNOSIS — R739 Hyperglycemia, unspecified: Secondary | ICD-10-CM | POA: Diagnosis present

## 2019-12-17 DIAGNOSIS — E860 Dehydration: Secondary | ICD-10-CM | POA: Diagnosis not present

## 2019-12-17 DIAGNOSIS — S0003XA Contusion of scalp, initial encounter: Secondary | ICD-10-CM | POA: Diagnosis not present

## 2019-12-17 DIAGNOSIS — E861 Hypovolemia: Secondary | ICD-10-CM | POA: Diagnosis not present

## 2019-12-17 DIAGNOSIS — Z831 Family history of other infectious and parasitic diseases: Secondary | ICD-10-CM | POA: Diagnosis not present

## 2019-12-17 DIAGNOSIS — K264 Chronic or unspecified duodenal ulcer with hemorrhage: Secondary | ICD-10-CM | POA: Diagnosis not present

## 2019-12-17 DIAGNOSIS — K920 Hematemesis: Secondary | ICD-10-CM

## 2019-12-17 DIAGNOSIS — R944 Abnormal results of kidney function studies: Secondary | ICD-10-CM | POA: Diagnosis present

## 2019-12-17 DIAGNOSIS — K209 Esophagitis, unspecified without bleeding: Secondary | ICD-10-CM | POA: Diagnosis not present

## 2019-12-17 DIAGNOSIS — K269 Duodenal ulcer, unspecified as acute or chronic, without hemorrhage or perforation: Secondary | ICD-10-CM

## 2019-12-17 DIAGNOSIS — Z85828 Personal history of other malignant neoplasm of skin: Secondary | ICD-10-CM | POA: Diagnosis not present

## 2019-12-17 DIAGNOSIS — R55 Syncope and collapse: Secondary | ICD-10-CM | POA: Diagnosis not present

## 2019-12-17 DIAGNOSIS — K222 Esophageal obstruction: Secondary | ICD-10-CM | POA: Diagnosis not present

## 2019-12-17 DIAGNOSIS — Z79899 Other long term (current) drug therapy: Secondary | ICD-10-CM | POA: Diagnosis not present

## 2019-12-17 DIAGNOSIS — Z823 Family history of stroke: Secondary | ICD-10-CM | POA: Diagnosis not present

## 2019-12-17 DIAGNOSIS — I951 Orthostatic hypotension: Secondary | ICD-10-CM | POA: Diagnosis not present

## 2019-12-17 DIAGNOSIS — K5900 Constipation, unspecified: Secondary | ICD-10-CM | POA: Diagnosis present

## 2019-12-17 DIAGNOSIS — D72829 Elevated white blood cell count, unspecified: Secondary | ICD-10-CM | POA: Diagnosis not present

## 2019-12-17 DIAGNOSIS — Z8719 Personal history of other diseases of the digestive system: Secondary | ICD-10-CM | POA: Diagnosis not present

## 2019-12-17 DIAGNOSIS — Z87891 Personal history of nicotine dependence: Secondary | ICD-10-CM | POA: Diagnosis not present

## 2019-12-17 HISTORY — PX: ESOPHAGOGASTRODUODENOSCOPY (EGD) WITH PROPOFOL: SHX5813

## 2019-12-17 HISTORY — PX: BIOPSY: SHX5522

## 2019-12-17 HISTORY — PX: HEMOSTASIS CONTROL: SHX6838

## 2019-12-17 HISTORY — PX: HEMOSTASIS CLIP PLACEMENT: SHX6857

## 2019-12-17 LAB — COMPREHENSIVE METABOLIC PANEL
ALT: 15 U/L (ref 0–44)
AST: 15 U/L (ref 15–41)
Albumin: 2.7 g/dL — ABNORMAL LOW (ref 3.5–5.0)
Alkaline Phosphatase: 36 U/L — ABNORMAL LOW (ref 38–126)
Anion gap: 7 (ref 5–15)
BUN: 20 mg/dL (ref 8–23)
CO2: 22 mmol/L (ref 22–32)
Calcium: 8.2 mg/dL — ABNORMAL LOW (ref 8.9–10.3)
Chloride: 113 mmol/L — ABNORMAL HIGH (ref 98–111)
Creatinine, Ser: 0.79 mg/dL (ref 0.61–1.24)
GFR calc Af Amer: >60 mL/min (ref >60)
GFR calc non Af Amer: >60 mL/min (ref >60)
Glucose, Bld: 126 mg/dL — ABNORMAL HIGH (ref 70–99)
Potassium: 3.8 mmol/L (ref 3.5–5.1)
Sodium: 142 mmol/L (ref 135–145)
Total Bilirubin: 0.8 mg/dL (ref 0.3–1.2)
Total Protein: 5.1 g/dL — ABNORMAL LOW (ref 6.5–8.1)

## 2019-12-17 LAB — HEMOGLOBIN AND HEMATOCRIT, BLOOD
HCT: 27.7 % — ABNORMAL LOW (ref 39.0–52.0)
Hemoglobin: 9.1 g/dL — ABNORMAL LOW (ref 13.0–17.0)

## 2019-12-17 LAB — LACTIC ACID, PLASMA: Lactic Acid, Venous: 0.9 mmol/L (ref 0.5–1.9)

## 2019-12-17 LAB — HEMOGLOBIN A1C
Hgb A1c MFr Bld: 5.3 % (ref 4.8–5.6)
Mean Plasma Glucose: 105.41 mg/dL

## 2019-12-17 LAB — PROCALCITONIN: Procalcitonin: <0.1 ng/mL

## 2019-12-17 SURGERY — ESOPHAGOGASTRODUODENOSCOPY (EGD) WITH PROPOFOL
Anesthesia: Moderate Sedation

## 2019-12-17 MED ORDER — DIPHENHYDRAMINE HCL 50 MG/ML IJ SOLN
INTRAMUSCULAR | Status: AC
  Filled 2019-12-17: qty 1

## 2019-12-17 MED ORDER — MIDAZOLAM HCL (PF) 5 MG/ML IJ SOLN
INTRAMUSCULAR | Status: AC
  Filled 2019-12-17: qty 2

## 2019-12-17 MED ORDER — EPINEPHRINE 1 MG/10ML IJ SOSY
PREFILLED_SYRINGE | INTRAMUSCULAR | Status: AC
  Filled 2019-12-17: qty 10

## 2019-12-17 MED ORDER — SODIUM CHLORIDE (PF) 0.9 % IJ SOLN
PREFILLED_SYRINGE | INTRAMUSCULAR | Status: DC | PRN
  Administered 2019-12-17: 2 mL

## 2019-12-17 MED ORDER — BUTAMBEN-TETRACAINE-BENZOCAINE 2-2-14 % EX AERO
INHALATION_SPRAY | CUTANEOUS | Status: DC | PRN
  Administered 2019-12-17: 2 via TOPICAL

## 2019-12-17 MED ORDER — FENTANYL CITRATE (PF) 100 MCG/2ML IJ SOLN
INTRAMUSCULAR | Status: DC | PRN
  Administered 2019-12-17 (×2): 25 ug via INTRAVENOUS

## 2019-12-17 MED ORDER — FENTANYL CITRATE (PF) 100 MCG/2ML IJ SOLN
INTRAMUSCULAR | Status: AC
  Filled 2019-12-17: qty 4

## 2019-12-17 MED ORDER — PANTOPRAZOLE SODIUM 40 MG IV SOLR
40.0000 mg | Freq: Two times a day (BID) | INTRAVENOUS | Status: DC
  Administered 2019-12-17 – 2019-12-18 (×3): 40 mg via INTRAVENOUS
  Filled 2019-12-17 (×3): qty 40

## 2019-12-17 MED ORDER — SODIUM CHLORIDE 0.9 % IV SOLN: INTRAVENOUS | Status: DC

## 2019-12-17 MED ORDER — MIDAZOLAM HCL (PF) 10 MG/2ML IJ SOLN
INTRAMUSCULAR | Status: DC | PRN
  Administered 2019-12-17: 2 mg via INTRAVENOUS

## 2019-12-17 SURGICAL SUPPLY — 14 items

## 2019-12-17 NOTE — Op Note (Signed)
[facility] Patient Name: Brian Welch Procedure Date : 12/17/2019 MRN: [id number] Attending MD: Docia Chuck. Henrene Pastor , MD Date of Birth: 04-22-1932 CSN: [id number] Age: 86 Admit Type: Inpatient Procedure:                Upper GI endoscopy with control of bleeding; with                            biopsies Indications:              Coffee-ground emesis, Melena Providers:                Docia Chuck. Henrene Pastor, MD, Carmie End, RN, Tyna Jaksch Technician, Theodora Blow, Technician Referring MD:             [facility] Medicines:                Monitored Anesthesia Care Complications:            No immediate complications. Estimated Blood Loss:     Estimated blood loss: none. Procedure:                Pre-Anesthesia Assessment:                           - Prior to the procedure, a History and Physical                            was performed, and patient medications and                            allergies were reviewed. The patient's tolerance of                            previous anesthesia was also reviewed. The risks                            and benefits of the procedure and the sedation                            options and risks were discussed with the patient.                            All questions were answered, and informed consent                            was obtained. Prior Anticoagulants: The patient has                            taken no previous anticoagulant or antiplatelet                            agents. ASA Grade Assessment: II - A patient with  mild systemic disease. After reviewing the risks                            and benefits, the patient was deemed in                            satisfactory condition to undergo the procedure.                           After obtaining informed consent, the endoscope was                            passed under direct vision. Throughout the                             procedure, the patient's blood pressure, pulse, and                            oxygen saturations were monitored continuously. The                            [id number]-H190 ([id number]) Olympus gastroscope was                            introduced through the mouth, and advanced to the                            second part of duodenum. The upper GI endoscopy was                            accomplished without difficulty. The patient                            tolerated the procedure well. Scope In: Scope Out: Findings:      The esophagus revealed a ringlike distal esophageal stricture with mild       esophagitis.      The stomach a small hiatal hernia and several prepyloric erosions.       Biopsies were taken with a cold forceps for Helicobacter pylori testing       using CLOtest.      One non-bleeding cratered duodenal ulcer with pigmented material was       found in the duodenal bulb. The lesion was 18 mm in largest dimension.       Area was successfully injected with 2 mL of a 1:10,000 solution of       epinephrine for hemostasis. An attempt at endoscopic clip placement was       unsuccessful due to the firm nature of the ulcer. Final appearance of       the ulcer did not reveal any stigmata and no further therapy felt       necessary. There was deformity of the junction between the bulb and       second portion suggesting history of chronic ulcer disease.      The cardia and gastric fundus were normal on retroflexion. Impression:  1. Large duodenal ulcer without high risk stigmata                            or active bleeding status post epinephrine                            injection therapy                           2.. Large caliber esophageal stricture with                            esophagitis                           3. Antral erosions                           4. Otherwise unremarkable exam                           5. Status post CLO biopsy Recommendation:            1. Continue PPI                           2. Regular diet                           3. Monitor overnight                           4. Avoid NSAIDs                           5. GI will follow up tomorrow. We will assess and                            provide final recommendations as well as follow-up                            plans. If the patient is doing well, he may be able                            to go home tomorrow (but please wait until GI sees                            him).                           I have communicated this information by telephone                            to the patient's son Brian Welch [phone] Procedure Code(s):        --- Professional ---                           [id number],  59, Esophagogastroduodenoscopy, flexible,                            transoral; with control of bleeding, any method                           43239, Esophagogastroduodenoscopy, flexible,                            transoral; with biopsy, single or multiple Diagnosis Code(s):        --- Professional ---                           K26.9, Duodenal ulcer, unspecified as acute or                            chronic, without hemorrhage or perforation                           K92.0, Hematemesis                           K92.1, Melena (includes Hematochezia) CPT copyright 2019 American Medical Association. All rights reserved. The codes documented in this report are preliminary and upon coder review may  be revised to meet current compliance requirements. Docia Chuck. Henrene Pastor, MD 12/17/2019 2:57:55 PM This report has been signed electronically. Number of Addenda: 0

## 2019-12-17 NOTE — Progress Notes (Addendum)
PROGRESS NOTE  RYNE MCTIGUE HKV:[id number] DOB: [birth year] DOA: 12/16/2019 PCP: [name], MD  HPI/Recap of past 24 hours: HPI: Brian Welch is a 86 y.o. male fairly healthy male without significant medical problems and on no chronic medications, independent, presents to the hospital with complaints of presyncopal episode.  Patient tells me that he woke up around 3 AM, felt very weak, lightheaded, went to the bathroom and also reported nausea.  He felt so weak that was about to pass out and guided himself to the floor, denies any loss of consciousness.  He overall had two episodes of vomiting.  He denies any chest pain, denies any fever or chills.  No abdominal pain, no nausea currently.  Patient tells me that he has been working out in his vegetable yard for the past couple of days, does not think he ate and drank enough.  He also got onto his bicycle and distributed some of his vegetables to the neighbors.  ED Course: In the emergency room he is found to be orthostatic, he is afebrile, has received 2 L of normal saline and still symptomatic and hypotensive upon standing.  His blood work shows a leukocytosis of 14 K, BUN 30 creatinine 0.95.  His urinalysis shows ketones but no evidence of infection.  Due to persistent orthostasis following 2 L of normal saline we are asked to admit.  After his admission, developed acute blood loss and positive FOBT.  Hemoglobin 10.1 from 13.0.  Last colonoscopy was in 2010 by GI [place], had polyps removed.  12/17/19: Seen and examined with his spouse of 49 years at bedside.  Reports bloody stools this morning.  Denies any abdominal pain or nausea.  GI [place] consulted to further assess.  Assessment/Plan: Active Problems:   Pre-syncope  Presyncope/orthostatic hypotension likely secondary to acute GI bleed Presented with complaints of near syncope after working in his garden, riding his bike, positive orthostatic vital signs, hypovolemic on exam.   Initial lab studies unremarkable except for elevated BUN for suspected dehydration.  CT head unremarkable for any acute intracranial findings Received IV fluid hydration After his admission developed bloody stools with positive FOBT. Acute blood loss with hemoglobin drop from 13K to 10K. Repeat orthostatic vital signs  GI consulted for suspected lower GI bleed  Acute blood loss anemia suspect secondary to acute GI bleed Management stated above Continue IV fluids Maintain hemoglobin greater than 8 Repeat H&H this afternoon at 1700 Maintain MAP greater than 65 GI consulted, appreciate assistance.  Acute GI bleed, suspect lower Management as stated above  Hypotension in the setting of GI bleed MAP dropped to 64 Continue IV fluid Transfuse as indicated Maintain MAP greater than 65  Leukocytosis, suspect reactive in the setting of acute GI bleed Leukocytosis has resolved Afebrile, nontoxic-appearing No evidence of active infective process.  Hyperglycemia, no prior history of diabetes Admission serum blood glucose greater than 190 Obtain hemoglobin A1c Start sensitive insulin sliding scale     Code Status: Full code  Family Communication: Updated wife at bedside    Consultants:  GI [place]  Procedures:  None  Antimicrobials:  None  DVT prophylaxis: SCDs  Status is: Observation   Dispo:  Patient From: Home  Planned Disposition: Home  Expected discharge date: 12/18/19  Medically stable for discharge: No ongoing work-up for GI bleed.    Objective: Vitals:   12/17/19 1346 12/17/19 1355 12/17/19 1400 12/17/19 1405  BP: 120/67 120/62 117/66 113/62  Pulse: 69  66 68  Resp: (!) 27  16 15   Temp: 98.2 F (36.8 C)     TempSrc: Oral     SpO2: 98%  98% 98%  Weight: 70.3 kg     Height: 5\' 11"  (1.803 m)       Intake/Output Summary (Last 24 hours) at 12/17/2019 1414 Last data filed at 12/17/2019 1200 Gross per 24 hour  Intake 2495.18 ml  Output --  Net  2495.18 ml   Filed Weights   12/16/19 0558 12/17/19 1346  Weight: 70.3 kg 70.3 kg    Exam:  . General: 86 y.o. year-old male well developed well nourished in no acute distress.  Alert and oriented x3. . Cardiovascular: Regular rate and rhythm with no rubs or gallops.  No thyromegaly or JVD noted.   Marland Kitchen Respiratory: Clear to auscultation with no wheezes or rales. Good inspiratory effort. . Abdomen: Soft nontender nondistended with normal bowel sounds x4 quadrants. . Musculoskeletal: No lower extremity edema. 2/4 pulses in all 4 extremities. Marland Kitchen Psychiatry: Mood is appropriate for condition and setting   Data Reviewed: CBC: Recent Labs  Lab 12/16/19 0552 12/17/19 0503  WBC 14.0* 10.4  NEUTROABS 11.1* 8.2*  HGB 13.0 10.1*  HCT 40.0 30.5*  MCV 103.6* 101.7*  PLT 209 106   Basic Metabolic Panel: Recent Labs  Lab 12/16/19 0552 12/17/19 0503  NA 140 142  K 3.7 3.8  CL 107 113*  CO2 23 22  GLUCOSE 193* 126*  BUN 30* 20  CREATININE 0.95 0.79  CALCIUM 8.3* 8.2*  MG 2.0  --    GFR: Estimated Creatinine Clearance: 67.1 mL/min (by C-G formula based on SCr of 0.79 mg/dL). Liver Function Tests: Recent Labs  Lab 12/16/19 0552 12/17/19 0503  AST 19 15  ALT 14 15  ALKPHOS 43 36*  BILITOT 1.3* 0.8  PROT 5.7* 5.1*  ALBUMIN 3.3* 2.7*   Recent Labs  Lab 12/16/19 0552  LIPASE 35   No results for input(s): AMMONIA in the last 168 hours. Coagulation Profile: Recent Labs  Lab 12/16/19 0552  INR 1.1   Cardiac Enzymes: Recent Labs  Lab 12/16/19 0552  CKTOTAL 90   BNP (last 3 results) No results for input(s): PROBNP in the last 8760 hours. HbA1C: No results for input(s): HGBA1C in the last 72 hours. CBG: Recent Labs  Lab 12/16/19 0609  GLUCAP 188*   Lipid Profile: No results for input(s): CHOL, HDL, LDLCALC, TRIG, CHOLHDL, LDLDIRECT in the last 72 hours. Thyroid Function Tests: No results for input(s): TSH, T4TOTAL, FREET4, T3FREE, THYROIDAB in the last 72  hours. Anemia Panel: No results for input(s): VITAMINB12, FOLATE, FERRITIN, TIBC, IRON, RETICCTPCT in the last 72 hours. Urine analysis:    Component Value Date/Time   COLORURINE YELLOW 12/16/2019 0840   APPEARANCEUR CLEAR 12/16/2019 0840   LABSPEC 1.021 12/16/2019 0840   PHURINE 5.0 12/16/2019 0840   GLUCOSEU NEGATIVE 12/16/2019 0840   HGBUR NEGATIVE 12/16/2019 0840   BILIRUBINUR NEGATIVE 12/16/2019 0840   BILIRUBINUR Negative 03/10/2017 1210   KETONESUR 20 (A) 12/16/2019 0840   PROTEINUR NEGATIVE 12/16/2019 0840   UROBILINOGEN 0.2 03/10/2017 1210   NITRITE NEGATIVE 12/16/2019 0840   LEUKOCYTESUR NEGATIVE 12/16/2019 0840   Sepsis Labs: @LABRCNTIP (procalcitonin:4,lacticidven:4)  ) Recent Results (from the past 240 hour(s))  SARS Coronavirus 2 by RT PCR (hospital order, performed in [place] hospital lab) Nasopharyngeal Nasopharyngeal Swab     Status: None   Collection Time: 12/16/19 10:12 AM   Specimen: Nasopharyngeal Swab  Result Value Ref Range  Status   SARS Coronavirus 2 NEGATIVE NEGATIVE Final    Comment: (NOTE) SARS-CoV-2 target nucleic acids are NOT DETECTED.  The SARS-CoV-2 RNA is generally detectable in upper and lower respiratory specimens during the acute phase of infection. The lowest concentration of SARS-CoV-2 viral copies this assay can detect is 250 copies / mL. A negative result does not preclude SARS-CoV-2 infection and should not be used as the sole basis for treatment or other patient management decisions.  A negative result may occur with improper specimen collection / handling, submission of specimen other than nasopharyngeal swab, presence of viral mutation(s) within the areas targeted by this assay, and inadequate number of viral copies (<250 copies / mL). A negative result must be combined with clinical observations, patient history, and epidemiological information.  Fact Sheet for Patients:   StrictlyIdeas.no  Fact  Sheet for Healthcare Providers: BankingDealers.co.za  This test is not yet approved or  cleared by the [place] FDA and has been authorized for detection and/or diagnosis of SARS-CoV-2 by FDA under an Emergency Use Authorization (EUA).  This EUA will remain in effect (meaning this test can be used) for the duration of the COVID-19 declaration under Section 564(b)(1) of the Act, 21 U.S.C. section 360bbb-3(b)(1), unless the authorization is terminated or revoked sooner.  Performed at [facility], [place] [address], [place], [place] [address]       Studies: No results found.  Scheduled Meds: . [MAR Hold] pantoprazole (PROTONIX) IV  40 mg Intravenous Q12H    Continuous Infusions: . sodium chloride 100 mL/hr at 12/17/19 0903  . sodium chloride       LOS: 0 days     Kayleen Memos, MD [facility] Pager [phone]  If 7PM-7AM, please contact night-coverage [email or web address] Password [email or web address] 12/17/2019, 2:14 PM

## 2019-12-17 NOTE — Progress Notes (Signed)
Patient off floor to endo.  

## 2019-12-17 NOTE — Consult Note (Addendum)
Referring Provider:  Dr. Nevada Crane, [facility] Primary Care Physician:  [name], MD Primary Gastroenterologist:  Dr. Fuller Plan  Reason for Consultation:  GI bleed  HPI: Brian Welch is a 86 y.o. male with no significant PMH who present to [facility] hospital with complaints of feeling weak, presyncope, nausea, and episodes of vomiting.  They says that the emesis did have some blood in it but they initially thought that it was red from the beets that they were eating recently.  Initial evaluation via labs showed BUN elevated at 30 and leukocytosis of 14.  Hemoglobin was normal at 13 grams and he was hemoccult negative.  This morning BUN has normalized, but hemoglobin down to 10.1 g.  He passed some bloody stool yesterday and that has continued this morning.  It was Hemoccult positive this AM and Hgb down to 10.1 grams.  Has been on regular diet, not on PPI.  Feels good now.  No further nausea or vomiting.  No abdominal pain.  No heartburn/reflux/indigestion.  No dysphagia.  Has normal BMs at home, even leading up to this.  Does not use NSAIDs.  Never had any GI bleeding issues in the past.     Last colonoscopy with Dr. Fuller Plan was in December 2010 at which time he was found to have a couple of small polyps removed as well as mild diverticulosis in the sigmoid colon.  One polyp was a serrated adenoma and the others were hyperplastic polyps.  Has never had an EGD.   Past Medical History:  Diagnosis Date  . History of basal cell carcinoma 06/16/2014   2013 Left ear   . History of colonic polyps 06/16/2014   Adenoma 2010. Advised 5 year follow up.      Past Surgical History:  Procedure Laterality Date  . left eye cataract removal      Prior to Admission medications   Medication Sig Start Date End Date Taking? Authorizing Provider  Cholecalciferol (VITAMIN D-3 PO) Take 1 tablet by mouth daily.    Yes [provider]  co-enzyme Q-10 30 MG capsule Take 30 mg by mouth daily.    Yes [provider]  Garlic 10 MG CAPS Take 1 capsule by mouth daily.    Yes [provider]  Multiple Vitamins-Minerals (EYE VITAMINS PO) Take 1 tablet by mouth daily.    Yes [provider]  Omega-3 Fatty Acids (FISH OIL) 1000 MG CPDR Take 1 capsule by mouth daily.    Yes [provider]  RESVERATROL 100 MG CAPS Take 1 capsule by mouth daily.    Yes [provider]  ondansetron (ZOFRAN ODT) 4 MG disintegrating tablet Take 1 tablet (4 mg total) by mouth every 6 (six) hours as needed. 12/16/19   Ward, Delice Bison, DO    Current Facility-Administered Medications  Medication Dose Route Frequency Provider Last Rate Last Admin  . 0.9 %  sodium chloride infusion   Intravenous Continuous Caren Griffins, MD 100 mL/hr at 12/17/19 0903 New Bag at 12/17/19 0903  . acetaminophen (TYLENOL) tablet 650 mg  650 mg Oral Q6H PRN Caren Griffins, MD       Or  . acetaminophen (TYLENOL) suppository 650 mg  650 mg Rectal Q6H PRN Caren Griffins, MD      . ondansetron (ZOFRAN) tablet 4 mg  4 mg Oral Q6H PRN Caren Griffins, MD       Or  . ondansetron (ZOFRAN) injection 4 mg  4 mg  Intravenous Q6H PRN Caren Griffins, MD        Allergies as of 12/16/2019  . (No Known Allergies)    Family History  Problem Relation Age of Onset  . Stroke Mother        39, nonsmoker. lived to 79  . Tuberculosis Father        died 3 months before patient born    Social History   Socioeconomic History  . Marital status: Married    Spouse name: Not on file  . Number of children: 1  . Years of education: Not on file  . Highest education level: Not on file  Occupational History  . Occupation: Retired     Comment: Fish farm manager  . Smoking status: Former Smoker    Packs/day: 1.00    Years: 15.00    Pack years: 15.00    Types: Cigarettes    Quit date: 09/07/1973    Years since quitting: 46.3  . Smokeless tobacco: Never Used  Substance and Sexual Activity  . Alcohol  use: No    Alcohol/week: 0.0 standard drinks  . Drug use: No  . Sexual activity: Not on file  Other Topics Concern  . Not on file  Social History Narrative   Married (wife Arbie Cookey patient of Dr. Yong Channel). 1 son. No grandchildren.       Retired from Public relations account executive: exercise, see movies, plays and musicals   Social Determinants of Health   Financial Resource Strain:   . Difficulty of Paying Living Expenses:   Food Insecurity:   . Worried About Charity fundraiser in the Last Year:   . Arboriculturist in the Last Year:   Transportation Needs:   . Film/video editor (Medical):   Marland Kitchen Lack of Transportation (Non-Medical):   Physical Activity:   . Days of Exercise per Week:   . Minutes of Exercise per Session:   Stress:   . Feeling of Stress :   Social Connections:   . Frequency of Communication with Friends and Family:   . Frequency of Social Gatherings with Friends and Family:   . Attends Religious Services:   . Active Member of Clubs or Organizations:   . Attends Archivist Meetings:   Marland Kitchen Marital Status:   Intimate Partner Violence:   . Fear of Current or Ex-Partner:   . Emotionally Abused:   Marland Kitchen Physically Abused:   . Sexually Abused:     Review of Systems: ROS is O/W negative except as mentioned in HPI.  Physical Exam: Vital signs in last 24 hours: Temp:  [97.6 F (36.4 C)-98.5 F (36.9 C)] 98.1 F (36.7 C) (07/20 0600) Pulse Rate:  [81-101] 81 (07/20 0600) Resp:  [12-22] 16 (07/20 0600) BP: (92-118)/(60-78) 113/72 (07/20 0600) SpO2:  [95 %-98 %] 96 % (07/20 0600) Last BM Date: 12/15/19 General:  Alert, Well-developed, well-nourished, pleasant and cooperative in NAD Head:  Normocephalic and atraumatic. Eyes:  Sclera clear, no icterus.  Conjunctiva pink. Ears:  Normal auditory acuity. Mouth:  No deformity or lesions.   Lungs:  Clear throughout to auscultation.  No wheezes, crackles, or rhonchi.  Heart:  Regular rate and rhythm;  no murmurs, clicks, rubs, or gallops. Abdomen:  Soft, non-distended.  BS present.  Non-tender.  Msk:  Symmetrical without gross deformities. Pulses:  Normal pulses noted. Extremities:  Without clubbing or edema. Neurologic:  Alert and oriented x 4;  grossly normal neurologically.  Skin:  Intact without significant lesions or rashes. Psych:  Alert and cooperative. Normal mood and affect.  Intake/Output from previous day: 07/19 0701 - 07/20 0700 In: 1903.3 [I.V.:1903.3] Out: -   Lab Results: Recent Labs    12/16/19 0552 12/17/19 0503  WBC 14.0* 10.4  HGB 13.0 10.1*  HCT 40.0 30.5*  PLT 209 173   BMET Recent Labs    12/16/19 0552 12/17/19 0503  NA 140 142  K 3.7 3.8  CL 107 113*  CO2 23 22  GLUCOSE 193* 126*  BUN 30* 20  CREATININE 0.95 0.79  CALCIUM 8.3* 8.2*   LFT Recent Labs    12/17/19 0503  PROT 5.1*  ALBUMIN 2.7*  AST 15  ALT 15  ALKPHOS 36*  BILITOT 0.8   PT/INR Recent Labs    12/16/19 0552  LABPROT 13.7  INR 1.1   Studies/Results: CT Head Wo Contrast  Result Date: 12/16/2019 CLINICAL DATA:  Posttraumatic headache.  Emesis. EXAM: CT HEAD WITHOUT CONTRAST TECHNIQUE: Contiguous axial images were obtained from the base of the skull through the vertex without intravenous contrast. COMPARISON:  None. FINDINGS: Brain: There is no evidence of acute infarct, intracranial hemorrhage, mass, midline shift, or extra-axial fluid collection. Cerebral atrophy is within normal limits for age. Scattered hypodensities in the cerebral white matter bilaterally are nonspecific but compatible with mild chronic small vessel ischemic disease. Vascular: Calcified atherosclerosis at the skull base. No hyperdense vessel. Skull: No fracture or suspicious osseous lesion. Sinuses/Orbits: Small volume fluid in the left sphenoid sinus. Clear mastoid air cells. Left cataract extraction. Other: None. IMPRESSION: 1. No evidence of acute intracranial abnormality. 2. Mild chronic small  vessel ischemic disease. Electronically Signed   By: Logan Bores M.D.   On: 12/16/2019 06:30   DG Chest Portable 1 View  Result Date: 12/16/2019 CLINICAL DATA:  Syncopal episode. EXAM: PORTABLE CHEST 1 VIEW COMPARISON:  None. FINDINGS: The cardiac silhouette, mediastinal and hilar contours are normal for age. There is mild tortuosity and calcification of the thoracic aorta. Chronic appearing basilar scarring changes. No definite infiltrates or effusions. No pneumothorax. The bony thorax is intact. IMPRESSION: Chronic appearing basilar scarring changes. No acute pulmonary findings. Electronically Signed   By: Marijo Sanes M.D.   On: 12/16/2019 06:41    IMPRESSION:  86 year old male with essentially no past medical history who presented to [facility] with complaints of nausea, emesis with some blood (they thought it was from eating beets initially), weakness, and presyncope.  Initial evaluation via labs showed BUN elevated at 30 and leukocytosis of 14.  Hemoglobin was normal at 13 grams and he was hemoccult negative.  This morning BUN has normalized, but hemoglobin down to 10.1 g.  Has passed some bloody stool that was Hemoccult positive this AM and Hgb down to 10.1 grams. *GI bleed: 3 g drop in hemoglobin.  Suspect UGI bleed with elevated BUN, possible blood in emesis at home, etc.  ? Ulcer, esophagitis, malignancy. *ABLA:  As above.  PLAN: -EGD later today.  Has been made NPO. -I started pantoprazole 40 mg IV BID. -Monitor Hgb and transfuse if needed.  Laban Emperor. Zehr  12/17/2019, 9:05 AM  GI ATTENDING  History, laboratories, x-rays, prior endoscopy reports reviewed.  Patient personally seen and examined.  Healthy 86 year old who presents to the hospital with presyncope secondary to acute upper GI bleeding.  Agree with comprehensive consultation note as outlined above.  Patient is for urgent endoscopy this afternoon.The nature of the procedure, as  well as the risks, benefits, and  alternatives were carefully and thoroughly reviewed with the patient. Ample time for discussion and questions allowed. The patient understood, was satisfied, and agreed to proceed.  Docia Chuck. Geri Seminole., M.D. [facility] Division of Gastroenterology

## 2019-12-17 NOTE — Progress Notes (Signed)
Patient back to room from Endo.  

## 2019-12-18 ENCOUNTER — Encounter (HOSPITAL_COMMUNITY): Payer: Self-pay | Admitting: Internal Medicine

## 2019-12-18 DIAGNOSIS — R195 Other fecal abnormalities: Secondary | ICD-10-CM

## 2019-12-18 DIAGNOSIS — K269 Duodenal ulcer, unspecified as acute or chronic, without hemorrhage or perforation: Secondary | ICD-10-CM

## 2019-12-18 LAB — COMPREHENSIVE METABOLIC PANEL
ALT: 16 U/L (ref 0–44)
AST: 18 U/L (ref 15–41)
Albumin: 2.7 g/dL — ABNORMAL LOW (ref 3.5–5.0)
Alkaline Phosphatase: 35 U/L — ABNORMAL LOW (ref 38–126)
Anion gap: 5 (ref 5–15)
BUN: 9 mg/dL (ref 8–23)
CO2: 25 mmol/L (ref 22–32)
Calcium: 8.3 mg/dL — ABNORMAL LOW (ref 8.9–10.3)
Chloride: 109 mmol/L (ref 98–111)
Creatinine, Ser: 0.79 mg/dL (ref 0.61–1.24)
GFR calc Af Amer: >60 mL/min (ref >60)
GFR calc non Af Amer: >60 mL/min (ref >60)
Glucose, Bld: 98 mg/dL (ref 70–99)
Potassium: 3.4 mmol/L — ABNORMAL LOW (ref 3.5–5.1)
Sodium: 139 mmol/L (ref 135–145)
Total Bilirubin: 0.6 mg/dL (ref 0.3–1.2)
Total Protein: 4.8 g/dL — ABNORMAL LOW (ref 6.5–8.1)

## 2019-12-18 LAB — CBC WITH DIFFERENTIAL/PLATELET
Abs Immature Granulocytes: 0.05 10*3/uL (ref 0.00–0.07)
Abs Immature Granulocytes: 0.05 10*3/uL (ref 0.00–0.07)
Basophils Absolute: 0 10*3/uL (ref 0.0–0.1)
Basophils Absolute: 0 10*3/uL (ref 0.0–0.1)
Basophils Relative: 0 %
Basophils Relative: 1 %
Eosinophils Absolute: 0.1 10*3/uL (ref 0.0–0.5)
Eosinophils Absolute: 0.4 10*3/uL (ref 0.0–0.5)
Eosinophils Relative: 1 %
Eosinophils Relative: 6 %
HCT: 30.5 % — ABNORMAL LOW (ref 39.0–52.0)
HCT: 27 % — ABNORMAL LOW (ref 39.0–52.0)
Hemoglobin: 10.1 g/dL — ABNORMAL LOW (ref 13.0–17.0)
Hemoglobin: 8.9 g/dL — ABNORMAL LOW (ref 13.0–17.0)
Immature Granulocytes: 1 %
Immature Granulocytes: 1 %
Lymphocytes Relative: 13 %
Lymphocytes Relative: 24 %
Lymphs Abs: 1.4 10*3/uL (ref 0.7–4.0)
Lymphs Abs: 1.8 10*3/uL (ref 0.7–4.0)
MCH: 33.7 pg (ref 26.0–34.0)
MCH: 33 pg (ref 26.0–34.0)
MCHC: 33.1 g/dL (ref 30.0–36.0)
MCHC: 33 g/dL (ref 30.0–36.0)
MCV: 101.7 fL — ABNORMAL HIGH (ref 80.0–100.0)
MCV: 100 fL (ref 80.0–100.0)
Monocytes Absolute: 0.7 10*3/uL (ref 0.1–1.0)
Monocytes Absolute: 0.6 10*3/uL (ref 0.1–1.0)
Monocytes Relative: 7 %
Monocytes Relative: 8 %
Neutro Abs: 8.2 10*3/uL — ABNORMAL HIGH (ref 1.7–7.7)
Neutro Abs: 4.7 10*3/uL (ref 1.7–7.7)
Neutrophils Relative %: 78 %
Neutrophils Relative %: 60 %
Platelets: 173 10*3/uL (ref 150–400)
Platelets: 141 10*3/uL — ABNORMAL LOW (ref 150–400)
RBC: 3 MIL/uL — ABNORMAL LOW (ref 4.22–5.81)
RBC: 2.7 MIL/uL — ABNORMAL LOW (ref 4.22–5.81)
RDW: 13.2 % (ref 11.5–15.5)
RDW: 13.5 % (ref 11.5–15.5)
WBC: 10.4 10*3/uL (ref 4.0–10.5)
WBC: 7.7 10*3/uL (ref 4.0–10.5)
nRBC: 0 % (ref 0.0–0.2)
nRBC: 0 % (ref 0.0–0.2)

## 2019-12-18 LAB — IRON AND TIBC
Iron: 100 ug/dL (ref 45–182)
Saturation Ratios: 38 % (ref 17.9–39.5)
TIBC: 262 ug/dL (ref 250–450)
UIBC: 162 ug/dL

## 2019-12-18 LAB — HEMOGLOBIN A1C
Hgb A1c MFr Bld: 5.2 % (ref 4.8–5.6)
Mean Plasma Glucose: 102.54 mg/dL

## 2019-12-18 LAB — FERRITIN: Ferritin: 68 ng/mL (ref 24–336)

## 2019-12-18 LAB — VITAMIN B12: Vitamin B-12: 316 pg/mL (ref 180–914)

## 2019-12-18 LAB — HEMOGLOBIN AND HEMATOCRIT, BLOOD
HCT: 26.1 % — ABNORMAL LOW (ref 39.0–52.0)
Hemoglobin: 8.7 g/dL — ABNORMAL LOW (ref 13.0–17.0)

## 2019-12-18 LAB — FOLATE: Folate: 8.5 ng/mL (ref >5.9)

## 2019-12-18 LAB — CLOTEST (H. PYLORI), BIOPSY: Helicobacter screen: NEGATIVE

## 2019-12-18 MED ORDER — POTASSIUM CHLORIDE CRYS ER 20 MEQ PO TBCR
40.0000 meq | EXTENDED_RELEASE_TABLET | Freq: Two times a day (BID) | ORAL | Status: AC
  Administered 2019-12-18 (×2): 40 meq via ORAL
  Filled 2019-12-18 (×2): qty 2

## 2019-12-18 MED ORDER — PANTOPRAZOLE SODIUM 40 MG PO TBEC
40.0000 mg | DELAYED_RELEASE_TABLET | Freq: Two times a day (BID) | ORAL | Status: DC
  Administered 2019-12-18 – 2019-12-19 (×2): 40 mg via ORAL
  Filled 2019-12-18 (×2): qty 1

## 2019-12-18 NOTE — Evaluation (Signed)
Physical Therapy Evaluation and Discharge Patient Details Name: Brian Welch MRN: [id number] DOB: 03/02/1932 Today's Date: 12/18/2019   History of Present Illness  Brian Welch is a 86 y.o. male fairly healthy male without significant medical problems and on no chronic medications, independent, presents to the hospital with complaints of presyncopal episode.  Patient tells me that he woke up around 3 AM, felt very weak, lightheaded, went to the bathroom and also reported nausea.  He felt so weak that was about to pass out and guided himself to the floor, denies any loss of consciousness.  He overall had two episodes of vomiting.    Clinical Impression   Patient evaluated by Physical Therapy with no further acute PT needs identified. All education has been completed and the patient has no further questions. Son in room towards end of session; Orthostatics negative for BP drop; Dr. Nevada Crane aware;  See below for any follow-up Physical Therapy or equipment needs. PT is signing off. Thank you for this referral.     Follow Up Recommendations No PT follow up    Equipment Recommendations  None recommended by PT    Recommendations for Other Services       Precautions / Restrictions Precautions Precautions: None      Mobility  Bed Mobility Overal bed mobility: Independent                Transfers Overall transfer level: Independent                  Ambulation/Gait Ambulation/Gait assistance: Supervision;Independent Gait Distance (Feet): 600 Feet (greater than) Assistive device: None Gait Pattern/deviations: WFL(Within Functional Limits) Gait velocity: at or very near WNL   General Gait Details: Initial cues to self-monitor for activity tolerance; managing quite well  Stairs            Wheelchair Mobility    Modified Rankin (Stroke Patients Only)       Balance Overall balance assessment: No apparent balance deficits (not formally assessed)                                            Pertinent Vitals/Pain Pain Assessment: No/denies pain    Home Living Family/patient expects to be discharged to:: Private residence Living Arrangements: Spouse/significant other (and adult son) Available Help at Discharge: Family;Available PRN/intermittently Type of Home: House Home Access: Stairs to enter   [employer] of Steps: 6 Home Layout: One level        Prior Function Level of Independence: Independent         Comments: Enjoys his vegetable garden; 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     Hand Dominance        Extremity/Trunk Assessment   Upper Extremity Assessment Upper Extremity Assessment: Overall WFL for tasks assessed    Lower Extremity Assessment Lower Extremity Assessment: Overall WFL for tasks assessed       Communication   Communication: No difficulties  Cognition Arousal/Alertness: Awake/alert Behavior During Therapy: WFL for tasks assessed/performed Overall Cognitive Status: Within Functional Limits for tasks assessed                                        General Comments General comments (skin integrity, edema, etc.):   12/18/19 3570  Vital Signs  Patient Position (if appropriate) Orthostatic Vitals  Orthostatic Lying   BP- Lying 123/67  Pulse- Lying 75  Orthostatic Sitting  BP- Sitting 113/59  Pulse- Sitting 72  Orthostatic Standing at 0 minutes  BP- Standing at 0 minutes 116/64  Pulse- Standing at 0 minutes 79  Orthostatic Standing at 3 minutes  BP- Standing at 3 minutes 133/68 (after ambulating in hallway)  Pulse- Standing at 3 minutes 90       Exercises     Assessment/Plan    PT Assessment Patent does not need any further PT services  PT Problem List         PT Treatment Interventions      PT Goals (Current goals can be found in the Care Plan section)  Acute Rehab PT Goals Patient Stated Goal: get home, and back to [facility] when able PT Goal Formulation: All assessment and education complete, DC therapy    Frequency     Barriers to discharge        Co-evaluation               AM-PAC PT "6 Clicks" Mobility  Outcome Measure Help needed turning from your back to your side while in a flat bed without using bedrails?: None Help needed moving from lying on your back to sitting on the side of a flat bed without using bedrails?: None Help needed moving to and from a bed to a chair (including a wheelchair)?: None Help needed standing up from a chair using your arms (e.g., wheelchair or bedside chair)?: None Help needed to walk in hospital room?: None Help needed climbing 3-5 steps with a railing? : None 6 Click Score: 24    End of Session   Activity Tolerance: Patient tolerated treatment well Patient left: in bed;with call bell/phone within reach (Sitting EOB, Dr. Nevada Crane in to see him) Nurse Communication: Mobility status PT Visit Diagnosis: Other abnormalities of gait and mobility (R26.89)    Time: 8185-6314 PT Time Calculation (min) (ACUTE ONLY): 27 min   Charges:   PT Evaluation $PT Eval Low Complexity: 1 Low PT Treatments $Gait Training: 8-22 mins        Roney Marion, PT  Acute Rehabilitation Services Pager [phone] Office [phone]   Colletta [place] 12/18/2019, 9:50 AM

## 2019-12-18 NOTE — Progress Notes (Addendum)
Daily Rounding Note  12/18/2019, 10:26 AM  LOS: 1 day   SUBJECTIVE:   Chief complaint: gi bleed.  Scant hematemesis??, bloody stool. Now with darker "bloody stool" (pt).   wallking fine in hall.   Not dizzy, weak.  Eating solids.    Son concerned by Hgb 9.1 >> 8.9 overnight.  Peak wa 13 at arrival.   OBJECTIVE:         Vital signs in last 24 hours:    Temp:  [97.7 F (36.5 C)-98.7 F (37.1 C)] 97.7 F (36.5 C) (07/21 0419) Pulse Rate:  [59-80] 68 (07/21 0419) Resp:  [14-27] 16 (07/21 0419) BP: (97-146)/(45-120) 109/57 (07/21 0419) SpO2:  [94 %-100 %] 98 % (07/21 0419) Weight:  [70.3 kg] 70.3 kg (07/20 1346) Last BM Date: 12/15/19 Filed Weights   12/16/19 0558 12/17/19 1346  Weight: 70.3 kg 70.3 kg   General: looks well   Heart: RRR Chest: clear bil Abdomen: soft, NT, ND.  No HSM no masses  Extremities: no CCE Neuro/Psych:  Oriented x 3.  Slow speech  Intake/Output from previous day: 07/20 0701 - 07/21 0700 In: 1091.2 [P.O.:360; I.V.:731.2] Out: -   Intake/Output this shift: No intake/output data recorded.  Lab Results: Recent Labs    12/16/19 0552 12/16/19 0552 12/17/19 0503 12/17/19 1945 12/18/19 0517  WBC 14.0*  --  10.4  --  7.7  HGB 13.0   < > 10.1* 9.1* 8.9*  HCT 40.0   < > 30.5* 27.7* 27.0*  PLT 209  --  173  --  141*   < > = values in this interval not displayed.   BMET Recent Labs    12/16/19 0552 12/17/19 0503 12/18/19 0517  NA 140 142 139  K 3.7 3.8 3.4*  CL 107 113* 109  CO2 23 22 25   GLUCOSE 193* 126* 98  BUN 30* 20 9  CREATININE 0.95 0.79 0.79  CALCIUM 8.3* 8.2* 8.3*   LFT Recent Labs    12/16/19 0552 12/17/19 0503 12/18/19 0517  PROT 5.7* 5.1* 4.8*  ALBUMIN 3.3* 2.7* 2.7*  AST 19 15 18   ALT 14 15 16   ALKPHOS 43 36* 35*  BILITOT 1.3* 0.8 0.6   PT/INR Recent Labs    12/16/19 0552  LABPROT 13.7  INR 1.1   Hepatitis Panel No results for input(s): HEPBSAG,  HCVAB, HEPAIGM, HEPBIGM in the last 72 hours.  Studies/Results: No results found.   Scheduled Meds: . pantoprazole (PROTONIX) IV  40 mg Intravenous Q12H  . potassium chloride  40 mEq Oral BID   Continuous Infusions: . sodium chloride 100 mL/hr at 12/18/19 0814   PRN Meds:.acetaminophen **OR** acetaminophen, ondansetron **OR** ondansetron (ZOFRAN) IV   ASSESMENT:   *   Gi bleed.  resolved bloody stool, now w what sounds like old blood 12/17/19 EGD: Large duodenal ulcer, not actively bleeding, no high risk bleeding stigmata.  Treated with epinephrine injection.  Large caliber esophageal stricture and esophagitis.  Antral erosions.  Results of CLO biopsy pending. No PPI etc PTA.   Do not suspect ongoing bleeding   *    Anemia.  Macrocyttosis.  Likely blood loss anemia.  Hb dropped from 13 >> 8.9 No iron, folate or B12 deficiency on anemia profile.  *   Serrated adenomatous and hyperplastic polyps on colonoscopy by Dr. Fuller Plan 04/2009, 5 year fup recommended but has not had follow-up colonoscopy.  *   Hypokalemia, got oral potassium this AM  PLAN   *   Per DR Henrene Pastor.  Pt's son concerned by Hgb drop and dark stool Check Hgb in AM.  Pt will stay additional night.  Continue solid food.    *   Switch to po PPI bid.    *   Arrange office GI fup in a few weeks, then schedule colonoscopy and ? fup EGD for future date.     Brian Welch  12/18/2019, 10:26 AM Phone [phone]  GI ATTENDING  Interval history data reviewed.  Agree with comprehensive interval progress note as outlined above.  I suspect his GI bleeding is stopped, but reasonable to keep in for an additional day of observation, just to be certain based on above.  We will see tomorrow and if ready for discharge, will provide final recommendations and arrange follow-up.  Docia Chuck. Geri Seminole., M.D. [facility] Division of Gastroenterology

## 2019-12-18 NOTE — Progress Notes (Signed)
PROGRESS NOTE  Brian Welch:[id number] DOB: 09-23-1931 DOA: 12/16/2019 PCP: [name], MD  HPI/Recap of past 24 hours: HPI: Brian Welch is a 86 y.o. male fairly healthy male without significant medical problems and on no chronic medications, independent, presents to the hospital with complaints of presyncopal episode.  Patient tells me that he woke up around 3 AM, felt very weak, lightheaded, went to the bathroom and also reported nausea.  He felt so weak that was about to pass out and guided himself to the floor, denies any loss of consciousness.  He overall had two episodes of vomiting.  He denies any chest pain, denies any fever or chills.  No abdominal pain, no nausea currently.  Patient tells me that he has been working out in his vegetable yard for the past couple of days, does not think he ate and drank enough.  He also got onto his bicycle and distributed some of his vegetables to the neighbors.  ED Course: In the emergency room he is found to be orthostatic, he is afebrile, has received 2 L of normal saline and still symptomatic and hypotensive upon standing.  His blood work shows a leukocytosis of 14 K, BUN 30 creatinine 0.95.  His urinalysis shows ketones but no evidence of infection.  Due to persistent orthostasis following 2 L of normal saline we are asked to admit.  After his admission, developed acute blood loss and positive FOBT.  Hemoglobin 10.1 from 13.0.  Last colonoscopy was in 2010 by [facility] [name], had polyps removed.  12/18/19: Seen and examined with his son at his bedside.  Reports 3 dark stools with visible blood on the toilet paper this morning.  Hemoglobin drop noted 8.9 from 10.1 yesterday.  Soft blood pressures, will continue IV fluid and repeat H&H this afternoon at 1600.   Assessment/Plan: Active Problems:   Pre-syncope   Duodenal ulcer hemorrhagic   Duodenal ulcer  Presyncope/orthostatic hypotension likely secondary to acute GI bleed Presented with  complaints of near syncope after working in his garden, riding his bike, positive orthostatic vital signs, hypovolemic on exam.  Initial lab studies unremarkable except for elevated BUN for suspected dehydration.  CT head unremarkable for any acute intracranial findings Received IV fluid hydration with resolution of orthostatic hypotension. Blood pressures are currently soft, will continue IV fluids normal saline at 100 cc/h After his admission, he developed bloody stools with positive FOBT. Reported hematemesis, subsequently had an urgent EGD on 12/17/19 by Dr. Henrene Pastor that revealed a large duodenal ulcer and esophagitis. Treated with epinephrine injection.  Large duodenal ulcer/large caliber esophageal stricture and esophagitis/antral erosions on EGD 12/17/19 Dr. Henrene Pastor. Treated with epinephrine injection Not on PPI prior to admission Started on Protonix 40 mg IV twice daily by GI, now on po BID, continue Continue to monitor H&H Follow biopsy results  Acute blood loss anemia suspect secondary to acute GI bleed Presented with hemoglobin of 13, positive FOBT, and above findings on EGD 12/17/19 Dr. Henrene Pastor. Noted hemoglobin drop this morning down to 8.9.   Continue to monitor today Repeat H&H this afternoon at 1600.    Acute GI bleed, Upper  Management as stated above Possible colonoscopy outpatient Management per GI  Hypotension in the setting of GI bleed Continue to monitor BP and MAP Continue IV fluid Transfuse as indicated Maintain MAP greater than 65  Resolved Leukocytosis, suspect reactive in the setting of acute GI bleed Leukocytosis has resolved Afebrile, nontoxic-appearing No evidence of active infective process.  Hyperglycemia, no prior history of diabetes Admission serum blood glucose greater than 190 Obtain hemoglobin A1c      Code Status: Full code  Family Communication: Updated son at bedside    Consultants:  GI  [place]  Procedures:  EGD  Antimicrobials:  None  DVT prophylaxis: SCDs  Status is: Inpatient   Dispo:  Patient From: Home  Planned Disposition: Home  Expected discharge date: 12/19/19  Medically stable for discharge: No drop in Hg and soft Bps.    Objective: Vitals:   12/17/19 1849 12/17/19 2316 12/18/19 0419 12/18/19 1225  BP: (!) 97/53 (!) 139/120 (!) 109/57 (!) 96/56  Pulse: 68 64 68 66  Resp: 16 16 16 16   Temp: 97.9 F (36.6 C) 97.9 F (36.6 C) 97.7 F (36.5 C) (!) 97.4 F (36.3 C)  TempSrc: Oral Oral Oral Oral  SpO2: 100% 98% 98% 99%  Weight:      Height:        Intake/Output Summary (Last 24 hours) at 12/18/2019 1416 Last data filed at 12/17/2019 1707 Gross per 24 hour  Intake 499.36 ml  Output --  Net 499.36 ml   Filed Weights   12/16/19 0558 12/17/19 1346  Weight: 70.3 kg 70.3 kg    Exam:  . General: 86 y.o. year-old male well-developed well-nourished in no acute distress.  Alert and oriented x3.   . Cardiovascular: Regular rate and rhythm no rubs or gallops.   Marland Kitchen Respiratory: Clear to auscultation no wheezes or rales.   . Abdomen: Soft mild discomfort with palpation of epigastric region.  Normal bowel sounds present. . Musculoskeletal: No lower extremity edema bilaterally.   Marland Kitchen Psychiatry: Mood is appropriate for condition and setting.   Data Reviewed: CBC: Recent Labs  Lab 12/16/19 0552 12/17/19 0503 12/17/19 1945 12/18/19 0517  WBC 14.0* 10.4  --  7.7  NEUTROABS 11.1* 8.2*  --  4.7  HGB 13.0 10.1* 9.1* 8.9*  HCT 40.0 30.5* 27.7* 27.0*  MCV 103.6* 101.7*  --  100.0  PLT 209 173  --  322*   Basic Metabolic Panel: Recent Labs  Lab 12/16/19 0552 12/17/19 0503 12/18/19 0517  NA 140 142 139  K 3.7 3.8 3.4*  CL 107 113* 109  CO2 23 22 25   GLUCOSE 193* 126* 98  BUN 30* 20 9  CREATININE 0.95 0.79 0.79  CALCIUM 8.3* 8.2* 8.3*  MG 2.0  --   --    GFR: Estimated Creatinine Clearance: 67.1 mL/min (by C-G formula based on SCr of  0.79 mg/dL). Liver Function Tests: Recent Labs  Lab 12/16/19 0552 12/17/19 0503 12/18/19 0517  AST 19 15 18   ALT 14 15 16   ALKPHOS 43 36* 35*  BILITOT 1.3* 0.8 0.6  PROT 5.7* 5.1* 4.8*  ALBUMIN 3.3* 2.7* 2.7*   Recent Labs  Lab 12/16/19 0552  LIPASE 35   No results for input(s): AMMONIA in the last 168 hours. Coagulation Profile: Recent Labs  Lab 12/16/19 0552  INR 1.1   Cardiac Enzymes: Recent Labs  Lab 12/16/19 0552  CKTOTAL 90   BNP (last 3 results) No results for input(s): PROBNP in the last 8760 hours. HbA1C: Recent Labs    12/17/19 1945  HGBA1C 5.3   CBG: Recent Labs  Lab 12/16/19 0609  GLUCAP 188*   Lipid Profile: No results for input(s): CHOL, HDL, LDLCALC, TRIG, CHOLHDL, LDLDIRECT in the last 72 hours. Thyroid Function Tests: No results for input(s): TSH, T4TOTAL, FREET4, T3FREE, THYROIDAB in the last 72 hours.  Anemia Panel: Recent Labs    12/18/19 0737  VITAMINB12 316  FOLATE 8.5  FERRITIN 68  TIBC 262  IRON 100   Urine analysis:    Component Value Date/Time   COLORURINE YELLOW 12/16/2019 0840   APPEARANCEUR CLEAR 12/16/2019 0840   LABSPEC 1.021 12/16/2019 0840   PHURINE 5.0 12/16/2019 0840   GLUCOSEU NEGATIVE 12/16/2019 0840   HGBUR NEGATIVE 12/16/2019 0840   BILIRUBINUR NEGATIVE 12/16/2019 0840   BILIRUBINUR Negative 03/10/2017 1210   KETONESUR 20 (A) 12/16/2019 0840   PROTEINUR NEGATIVE 12/16/2019 0840   UROBILINOGEN 0.2 03/10/2017 1210   NITRITE NEGATIVE 12/16/2019 0840   LEUKOCYTESUR NEGATIVE 12/16/2019 0840   Sepsis Labs: @LABRCNTIP (procalcitonin:4,lacticidven:4)  ) Recent Results (from the past 240 hour(s))  SARS Coronavirus 2 by RT PCR (hospital order, performed in [place] hospital lab) Nasopharyngeal Nasopharyngeal Swab     Status: None   Collection Time: 12/16/19 10:12 AM   Specimen: Nasopharyngeal Swab  Result Value Ref Range Status   SARS Coronavirus 2 NEGATIVE NEGATIVE Final    Comment:  (NOTE) SARS-CoV-2 target nucleic acids are NOT DETECTED.  The SARS-CoV-2 RNA is generally detectable in upper and lower respiratory specimens during the acute phase of infection. The lowest concentration of SARS-CoV-2 viral copies this assay can detect is 250 copies / mL. A negative result does not preclude SARS-CoV-2 infection and should not be used as the sole basis for treatment or other patient management decisions.  A negative result may occur with improper specimen collection / handling, submission of specimen other than nasopharyngeal swab, presence of viral mutation(s) within the areas targeted by this assay, and inadequate number of viral copies (<250 copies / mL). A negative result must be combined with clinical observations, patient history, and epidemiological information.  Fact Sheet for Patients:   StrictlyIdeas.no  Fact Sheet for Healthcare Providers: BankingDealers.co.za  This test is not yet approved or  cleared by the [place] FDA and has been authorized for detection and/or diagnosis of SARS-CoV-2 by FDA under an Emergency Use Authorization (EUA).  This EUA will remain in effect (meaning this test can be used) for the duration of the COVID-19 declaration under Section 564(b)(1) of the Act, 21 U.S.C. section 360bbb-3(b)(1), unless the authorization is terminated or revoked sooner.  Performed at [facility], [place] [address], [place], [place] [address]       Studies: No results found.  Scheduled Meds: . pantoprazole  40 mg Oral BID  . potassium chloride  40 mEq Oral BID    Continuous Infusions: . sodium chloride 100 mL/hr at 12/18/19 0814     LOS: 1 day     Kayleen Memos, MD [facility] Pager [phone]  If 7PM-7AM, please contact night-coverage [email or web address] Password [facility] 12/18/2019, 2:16 PM

## 2019-12-19 ENCOUNTER — Encounter: Payer: Self-pay | Admitting: Gastroenterology

## 2019-12-19 DIAGNOSIS — D62 Acute posthemorrhagic anemia: Secondary | ICD-10-CM

## 2019-12-19 LAB — CBC WITH DIFFERENTIAL/PLATELET
Abs Immature Granulocytes: 0.05 10*3/uL (ref 0.00–0.07)
Basophils Absolute: 0.1 10*3/uL (ref 0.0–0.1)
Basophils Relative: 1 %
Eosinophils Absolute: 0.5 10*3/uL (ref 0.0–0.5)
Eosinophils Relative: 8 %
HCT: 28.2 % — ABNORMAL LOW (ref 39.0–52.0)
Hemoglobin: 9.3 g/dL — ABNORMAL LOW (ref 13.0–17.0)
Immature Granulocytes: 1 %
Lymphocytes Relative: 29 %
Lymphs Abs: 1.9 10*3/uL (ref 0.7–4.0)
MCH: 33 pg (ref 26.0–34.0)
MCHC: 33 g/dL (ref 30.0–36.0)
MCV: 100 fL (ref 80.0–100.0)
Monocytes Absolute: 0.6 10*3/uL (ref 0.1–1.0)
Monocytes Relative: 9 %
Neutro Abs: 3.5 10*3/uL (ref 1.7–7.7)
Neutrophils Relative %: 52 %
Platelets: 154 10*3/uL (ref 150–400)
RBC: 2.82 MIL/uL — ABNORMAL LOW (ref 4.22–5.81)
RDW: 13.2 % (ref 11.5–15.5)
WBC: 6.7 10*3/uL (ref 4.0–10.5)
nRBC: 0 % (ref 0.0–0.2)

## 2019-12-19 MED ORDER — PANTOPRAZOLE SODIUM 40 MG PO TBEC: 40.0000 mg | DELAYED_RELEASE_TABLET | Freq: Two times a day (BID) | ORAL | 0 refills | Status: DC

## 2019-12-19 MED ORDER — PANTOPRAZOLE SODIUM 40 MG PO TBEC
40.0000 mg | DELAYED_RELEASE_TABLET | Freq: Every day | ORAL | 0 refills | Status: DC
Start: 2020-01-18 — End: 2020-03-23

## 2019-12-19 NOTE — Progress Notes (Addendum)
Daily Rounding Note  12/19/2019, 9:30 AM  LOS: 2 days   SUBJECTIVE:   Chief complaint: Duodenal ulcer, blood in stool.   Anemia.   Small amount dark, non-bloody stool yest, nothing since.  No N/V.  No dizziness.  + constipation  OBJECTIVE:         Vital signs in last 24 hours:    Temp:  [97.4 F (36.3 C)-98.4 F (36.9 C)] 97.8 F (36.6 C) (07/22 0527) Pulse Rate:  [66-73] 73 (07/22 0527) Resp:  [16] 16 (07/22 0527) BP: (96-132)/(56-70) 113/62 (07/22 0527) SpO2:  [97 %-100 %] 97 % (07/22 0527) Last BM Date: 12/18/19 Filed Weights   12/16/19 0558 12/17/19 1346  Weight: 70.3 kg 70.3 kg   General: aged, comfortable, not ill looking   Heart: RRR Chest: clear bil.  No labored resps Abdomen: soft, NT, ND.  Active BS  Extremities: no CCE Neuro/Psych:  Oriented x 3.  Appropriate.  No gross deficits or tremors  Intake/Output from previous day: 07/21 0701 - 07/22 0700 In: 2767.6 [P.O.:840; I.V.:1927.6] Out: -   Intake/Output this shift: Total I/O In: 360 [P.O.:360] Out: -   Lab Results: Recent Labs    12/17/19 0503 12/17/19 1945 12/18/19 0517 12/18/19 1510 12/19/19 0436  WBC 10.4  --  7.7  --  6.7  HGB 10.1*   < > 8.9* 8.7* 9.3*  HCT 30.5*   < > 27.0* 26.1* 28.2*  PLT 173  --  141*  --  154   < > = values in this interval not displayed.   BMET Recent Labs    12/17/19 0503 12/18/19 0517  NA 142 139  K 3.8 3.4*  CL 113* 109  CO2 22 25  GLUCOSE 126* 98  BUN 20 9  CREATININE 0.79 0.79  CALCIUM 8.2* 8.3*   LFT Recent Labs    12/17/19 0503 12/18/19 0517  PROT 5.1* 4.8*  ALBUMIN 2.7* 2.7*  AST 15 18  ALT 15 16  ALKPHOS 36* 35*  BILITOT 0.8 0.6    Scheduled Meds:  pantoprazole  40 mg Oral BID   Continuous Infusions: PRN Meds:.acetaminophen **OR** acetaminophen, ondansetron **OR** ondansetron (ZOFRAN) IV  ASSESMENT:   *   Gi bleed.  resolved bloody stool.   12/17/19 EGD: Large duodenal  ulcer, not actively bleeding or high risk stigmata.  S/p epinephrine injection.  Large caliber esophageal stricture and esophagitis.  Antral erosions.  Results of CLO biopsy pending. No PPI etc PTA.   Do not suspect ongoing bleeding  *    Anemia.  Macrocytosis.  Likely blood loss anemia.  Hb dropped from 13 >> 8.7 >> 9.3.  No transfusions to date.   No iron, folate or B12 deficiency on anemia profile.  *   Serrated adenomatous and HP polyps on 12/201colonoscopy by Dr. Fuller Plan 04/2009, 5 year fup recommended but has not had follow-up colonoscopy.  *   Hypokalemia, got oral potassium this AM    PLAN   *   Plan fup w PA Zehr 8/31 0930 [place] for discharge home.  Protonix 40 mg po bid for 1 month, then drop to 1 x daily.  Avoid NSAIDs (not using P?TA), avoid heavy exertion and prolonged outdoor activity in heat.   No GI restrictions on diet.       Azucena Freed  12/19/2019, 9:30 AM Phone [phone]  GI ATTENDING  Interval history data reviewed.  Agree with interval progress note.  Patient stable.  Hemoglobin stable.  Recommendations for treatment and follow-up of duodenal ulcer as outlined above.  Okay for discharge from GI standpoint.  Will sign off.  Docia Chuck. Geri Seminole., M.D. [facility] Division of Gastroenterology

## 2019-12-19 NOTE — Discharge Summary (Addendum)
Discharge Summary  Brian Welch QVZ:[id number] DOB: March 06, 1932  PCP: [name], MD  Admit date: 12/16/2019 Discharge date: 12/19/2019  Time spent: 35 minutes  Recommendations for Outpatient Follow-up:  1. Follow-up with GI 2. Follow-up with your primary care provider 3. Take your medications as prescribed  Recommendations per GI: Plan fup w PA Zehr 8/31 0930 [place] for discharge home.  Protonix 40 mg po bid for 1 month, then drop to 1 x daily.  Avoid NSAIDs (not using P?TA), avoid heavy exertion and prolonged outdoor activity in heat.   No GI restrictions on diet.        Discharge Diagnoses:  Active Hospital Problems   Diagnosis Date Noted  . Duodenal ulcer hemorrhagic   . Duodenal ulcer   . Pre-syncope 12/16/2019    Resolved Hospital Problems  No resolved problems to display.    Discharge Condition: Stable  Diet recommendation: Resume previous diet  Vitals:   12/18/19 2329 12/19/19 0527  BP: 132/65 113/62  Pulse: 70 73  Resp: 16 16  Temp: 98.4 F (36.9 C) 97.8 F (36.6 C)  SpO2: 98% 97%    History of present illness:  PIR:JJOACZY F Paulis a 86 y.o.malefairly healthy male without significant medical problems and on no prescribed medications, Welch, presents to the hospital with complaints of presyncopal episode, nausea and vomiting. Woke up around 3 AM, felt very weak, lightheaded, went to the bathroom, had nausea and vomited. He felt so weak that was about to pass out and guided himself to the floor, denies any loss of consciousness. Overall had two episodes of vomiting. He had been working in his vegetable garden for the past couple of days, does not think he ate or drank enough. He also got onto his bicycle and distributed some of his vegetables to his neighbors.  In the emergency room, he was found to be orthostatic, received 2 L of normal saline and was kept on maintenance fluid. His blood work shows a leukocytosis of 14 K, BUN 30  creatinine 0.95. His urinalysis shows ketones but no evidence of infection. Due to persistent orthostasis following IV fluid hydration [facility] was asked to admit.   After his admission, he developed acute blood loss anemia and had a positive FOBT.  Hemoglobin drop 10.1K from 13.0K.  Last colonoscopy was in 2010 by [facility], had polyps removed.  Reportedly also had hematemesis prior to this presentation.  GI was consulted and was taken to endoscopy for an urgent EGD.  EGD done on 12/17/2019 by Dr. Henrene Pastor revealed the following findings: Large duodenal ulcer, not actively bleeding or high risk stigmata.  S/p epinephrine injection. Large caliber esophageal stricture and esophagitis. Antral erosions. Results of CLO biopsy pending.  12/19/19: Seen and examined with his wife at his bedside.  He has no new complaints.  Hemoglobin uptrending, 9.3 this morning from 8.7.  No reported recurrent GI bleed.  He is eager to go home.   Hospital Course:  Active Problems:   Pre-syncope   Duodenal ulcer hemorrhagic   Duodenal ulcer  Presyncope/orthostatic hypotension, resolved post IV fluid hydration, likely secondary to acute GI bleed and acute dehydration Presented with complaints of near syncope after working in his garden, riding his bike, positive orthostatic vital signs, hypovolemic on exam.  Initial lab studies unremarkable except for elevated BUN for suspected dehydration.  CT head unremarkable for any acute intracranial findings Received IV fluid hydration with resolution of orthostatic hypotension. After his admission, he reported hematemesis and developed bloody  stools with positive FOBT. GI was consulted and subsequently had an urgent EGD on 12/17/19 by Dr. Henrene Pastor that revealed a large duodenal ulcer and esophagitis. Treated with epinephrine injection.  Large duodenal ulcer/large caliber esophageal stricture and esophagitis/antral erosions on EGD 12/17/19 Dr. Henrene Pastor. Treated with epinephrine  injection Not on PPI prior to admission Started on Protonix 40 mg IV twice daily by GI, now on po BID Continue Protonix 40 mg twice daily x30 days then Protonix 40 mg daily afterwards Avoid NSAIDs CLO biopsy pending. Follow-up with GI  Acute blood loss anemia suspect secondary to acute GI bleed Presented with hemoglobin of 13, positive FOBT, and above findings on EGD 12/17/19 Dr. Henrene Pastor. Hemoglobin is now stable and trending, 9.3 from 8.7  Acute GI bleed, Upper  Management as stated above Possible colonoscopy outpatient Follow-up with GI  Resolved acute dehydration post IV fluid Avoid dehydration especially when working outdoors  Resolved hypotension in the setting of GI bleed Has been maintaining MAP greater than 65. Orthostatic hypotension has resolved with IV fluid.  Resolved Leukocytosis, suspect reactive in the setting of acute GI bleed Leukocytosis has resolved Afebrile, nontoxic-appearing No evidence of active infective process.  Hyperglycemia, likely reactive, no prior history of diabetes Admission serum blood glucose greater than 190 Hemoglobin A1c 5.2 on 12/18/2019.      Code Status: Full code     Consultants:  [facility]  Procedures:  EGD  Antimicrobials:  None  DVT prophylaxis: SCDs  Discharge Exam: BP 113/62 (BP Location: Right Arm)   Pulse 73   Temp 97.8 F (36.6 C) (Oral)   Resp 16   Ht 5\' 11"  (1.803 m)   Wt 70.3 kg   SpO2 97%   BMI 21.62 kg/m  . General: 86 y.o. year-old male well developed well nourished in no acute distress.  Alert and oriented x3. . Cardiovascular: Regular rate and rhythm with no rubs or gallops.  No thyromegaly or JVD noted.   Marland Kitchen Respiratory: Clear to auscultation with no wheezes or rales. Good inspiratory effort. . Abdomen: Soft nontender nondistended with normal bowel sounds x4 quadrants. . Musculoskeletal: No lower extremity edema. 2/4 pulses in all 4 extremities. Marland Kitchen Psychiatry: Mood is  appropriate for condition and setting  Discharge Instructions You were cared for by a hospitalist during your hospital stay. If you have any questions about your discharge medications or the care you received while you were in the hospital after you are discharged, you can call the unit and asked to speak with the hospitalist on call if the hospitalist that took care of you is not available. Once you are discharged, your primary care physician will handle any further medical issues. Please note that NO REFILLS for any discharge medications will be authorized once you are discharged, as it is imperative that you return to your primary care physician (or establish a relationship with a primary care physician if you do not have one) for your aftercare needs so that they can reassess your need for medications and monitor your lab values.   Allergies as of 12/19/2019   No Known Allergies     Medication List    TAKE these medications   co-enzyme Q-10 30 MG capsule Take 30 mg by mouth daily.   EYE VITAMINS PO Take 1 tablet by mouth daily.   Fish Oil 1000 MG Cpdr Take 1 capsule by mouth daily.   Garlic 10 MG Caps Take 1 capsule by mouth daily.   ondansetron 4 MG disintegrating tablet  Commonly known as: Zofran ODT Take 1 tablet (4 mg total) by mouth every 6 (six) hours as needed.   pantoprazole 40 MG tablet Commonly known as: PROTONIX Take 1 tablet (40 mg total) by mouth 2 (two) times daily.   pantoprazole 40 MG tablet Commonly known as: Protonix Take 1 tablet (40 mg total) by mouth daily. Start taking on: January 18, 2020   Resveratrol 100 MG Caps Take 1 capsule by mouth daily.   VITAMIN D-3 PO Take 1 tablet by mouth daily.      No Known Allergies  Follow-up Information    [name], MD. Schedule an appointment as soon as possible for a visit in 2 days.   Specialty: Family Medicine Contact information: [place] [address] [phone]         Loralie Champagne, PA-C Follow up on 01/28/2020.   Specialty: Gastroenterology Why: 9:30 AM follow up with GI PA.   Contact information: [place] [address] [phone]                The results of significant diagnostics from this hospitalization (including imaging, microbiology, ancillary and laboratory) are listed below for reference.    Significant Diagnostic Studies: CT Head Wo Contrast  Result Date: 12/16/2019 CLINICAL DATA:  Posttraumatic headache.  Emesis. EXAM: CT HEAD WITHOUT CONTRAST TECHNIQUE: Contiguous axial images were obtained from the base of the skull through the vertex without intravenous contrast. COMPARISON:  None. FINDINGS: Brain: There is no evidence of acute infarct, intracranial hemorrhage, mass, midline shift, or extra-axial fluid collection. Cerebral atrophy is within normal limits for age. Scattered hypodensities in the cerebral white matter bilaterally are nonspecific but compatible with mild chronic small vessel ischemic disease. Vascular: Calcified atherosclerosis at the skull base. No hyperdense vessel. Skull: No fracture or suspicious osseous lesion. Sinuses/Orbits: Small volume fluid in the left sphenoid sinus. Clear mastoid air cells. Left cataract extraction. Other: None. IMPRESSION: 1. No evidence of acute intracranial abnormality. 2. Mild chronic small vessel ischemic disease. Electronically Signed   By: Logan Bores M.D.   On: 12/16/2019 06:30   DG Chest Portable 1 View  Result Date: 12/16/2019 CLINICAL DATA:  Syncopal episode. EXAM: PORTABLE CHEST 1 VIEW COMPARISON:  None. FINDINGS: The cardiac silhouette, mediastinal and hilar contours are normal for age. There is mild tortuosity and calcification of the thoracic aorta. Chronic appearing basilar scarring changes. No definite infiltrates or effusions. No pneumothorax. The bony thorax is intact. IMPRESSION: Chronic appearing basilar scarring changes. No acute pulmonary findings.  Electronically Signed   By: Marijo Sanes M.D.   On: 12/16/2019 06:41    Microbiology: Recent Results (from the past 240 hour(s))  SARS Coronavirus 2 by RT PCR (hospital order, performed in [facility] hospital lab) Nasopharyngeal Nasopharyngeal Swab     Status: None   Collection Time: 12/16/19 10:12 AM   Specimen: Nasopharyngeal Swab  Result Value Ref Range Status   SARS Coronavirus 2 NEGATIVE NEGATIVE Final    Comment: (NOTE) SARS-CoV-2 target nucleic acids are NOT DETECTED.  The SARS-CoV-2 RNA is generally detectable in upper and lower respiratory specimens during the acute phase of infection. The lowest concentration of SARS-CoV-2 viral copies this assay can detect is 250 copies / mL. A negative result does not preclude SARS-CoV-2 infection and should not be used as the sole basis for treatment or other patient management decisions.  A negative result may occur with improper specimen collection / handling, submission of specimen  other than nasopharyngeal swab, presence of viral mutation(s) within the areas targeted by this assay, and inadequate number of viral copies (<250 copies / mL). A negative result must be combined with clinical observations, patient history, and epidemiological information.  Fact Sheet for Patients:   StrictlyIdeas.no  Fact Sheet for Healthcare Providers: BankingDealers.co.za  This test is not yet approved or  cleared by the [place] FDA and has been authorized for detection and/or diagnosis of SARS-CoV-2 by FDA under an Emergency Use Authorization (EUA).  This EUA will remain in effect (meaning this test can be used) for the duration of the COVID-19 declaration under Section 564(b)(1) of the Act, 21 U.S.C. section 360bbb-3(b)(1), unless the authorization is terminated or revoked sooner.  Performed at [facility], [place] [address], [place], [place] [address]      Labs: Basic Metabolic  Panel: Recent Labs  Lab 12/16/19 0552 12/17/19 0503 12/18/19 0517  NA 140 142 139  K 3.7 3.8 3.4*  CL 107 113* 109  CO2 23 22 25   GLUCOSE 193* 126* 98  BUN 30* 20 9  CREATININE 0.95 0.79 0.79  CALCIUM 8.3* 8.2* 8.3*  MG 2.0  --   --    Liver Function Tests: Recent Labs  Lab 12/16/19 0552 12/17/19 0503 12/18/19 0517  AST 19 15 18   ALT 14 15 16   ALKPHOS 43 36* 35*  BILITOT 1.3* 0.8 0.6  PROT 5.7* 5.1* 4.8*  ALBUMIN 3.3* 2.7* 2.7*   Recent Labs  Lab 12/16/19 0552  LIPASE 35   No results for input(s): AMMONIA in the last 168 hours. CBC: Recent Labs  Lab 12/16/19 0552 12/16/19 0552 12/17/19 0503 12/17/19 1945 12/18/19 0517 12/18/19 1510 12/19/19 0436  WBC 14.0*  --  10.4  --  7.7  --  6.7  NEUTROABS 11.1*  --  8.2*  --  4.7  --  3.5  HGB 13.0   < > 10.1* 9.1* 8.9* 8.7* 9.3*  HCT 40.0   < > 30.5* 27.7* 27.0* 26.1* 28.2*  MCV 103.6*  --  101.7*  --  100.0  --  100.0  PLT 209  --  173  --  141*  --  154   < > = values in this interval not displayed.   Cardiac Enzymes: Recent Labs  Lab 12/16/19 0552  CKTOTAL 90   BNP: BNP (last 3 results) No results for input(s): BNP in the last 8760 hours.  ProBNP (last 3 results) No results for input(s): PROBNP in the last 8760 hours.  CBG: Recent Labs  Lab 12/16/19 0609  GLUCAP 188*       Signed:  Kayleen Memos, MD [facility] 12/19/2019, 10:24 AM

## 2019-12-19 NOTE — Discharge Instructions (Signed)
Esophagitis  Esophagitis is inflammation of the esophagus. The esophagus is the tube that carries food from your mouth to your stomach. Esophagitis can cause soreness or pain in the esophagus. This condition can make it difficult and painful to swallow. What are the causes? Most causes of esophagitis are not serious. Common causes of this condition include:  Gastroesophageal reflux disease (GERD). This is when stomach contents move back up into the esophagus (reflux).  Repeated vomiting.  An allergic reaction, especially caused by food allergies (eosinophilic esophagitis).  Injury to the esophagus by swallowing large pills with or without water, or swallowing certain types of medicines.  Swallowing (ingesting) harmful chemicals, such as household cleaning products.  Heavy alcohol use.  An infection of the esophagus. This most often occurs in people who have a weakened immune system.  Radiation or chemotherapy treatment for cancer.  Certain diseases such as sarcoidosis, Crohn's disease, and scleroderma. What are the signs or symptoms? Symptoms of this condition include:  Difficult or painful swallowing.  Pain with swallowing acidic liquids, such as citrus juices.  Pain with burping.  Chest pain.  Difficulty breathing.  Nausea.  Vomiting.  Pain in the abdomen.  Weight loss.  Ulcers in the mouth.  Patches of white material in the mouth (candidiasis).  Fever.  Coughing up blood or vomiting blood.  Stool that is black, tarry, or bright red. How is this diagnosed? Your health care provider will take a medical history and perform a physical exam. You may also have other tests, including:  An endoscopy to examine your esophagus and stomach with a small flexible tube with a camera.  A test that measures the acidity level in your esophagus.  A test that measures how much pressure is on your esophagus.  A barium swallow or modified barium swallow to show the shape,  size, and functioning of your esophagus.  Allergy tests. How is this treated? Treatment for this condition depends on the cause of your esophagitis. In some cases, steroids or other medicines may be given to help relieve your symptoms or to treat the underlying cause of your condition. You may have to make some lifestyle changes, such as:  Avoiding alcohol.  Quitting smoking.  Changing your diet.  Exercising.  Changing your sleep habits and your sleep environment. Follow these instructions at home: Medicines  Take over-the-counter and prescription medicines only as told by your health care provider.  Do not take aspirin, ibuprofen, or other NSAIDs unless your health care provider told you to do so.  If you have trouble taking pills: ? Use a pill splitter to decrease the size of the pill. This will decrease the chance of the pill getting stuck or injuring your esophagus. ? Drink water after you take a pill. Eating and drinking   Avoid foods and drinks that seem to make your symptoms worse.  Follow a diet as recommended by your health care provider. This may involve avoiding foods and drinks such as: ? Coffee and tea (with or without caffeine). ? Drinks that contain alcohol. ? Energy drinks and sports drinks. ? Carbonated drinks or sodas. ? Chocolate and cocoa. ? Peppermint and mint flavorings. ? Garlic and onions. ? Horseradish. ? Spicy and acidic foods, including peppers, chili powder, curry powder, vinegar, hot sauces, and barbecue sauce. ? Citrus fruit juices and citrus fruits, such as oranges, lemons, and limes. ? Tomato-based foods, such as red sauce, chili, salsa, and pizza with red sauce. ? Fried and fatty foods, such as   donuts, french fries, potato chips, and high-fat dressings. ? High-fat meats, such as hot dogs and fatty cuts of red and white meats, such as rib eye steak, sausage, ham, and bacon. ? High-fat dairy items, such as whole milk, butter, and cream  cheese. Lifestyle  Eat small, frequent meals instead of large meals.  Avoid drinking large amounts of liquid with your meals.  Avoid eating meals during the 2-3 hours before bedtime.  Avoid lying down right after you eat.  Do not exercise right after you eat.  Do not use any products that contain nicotine or tobacco, such as cigarettes and e-cigarettes. If you need help quitting, ask your health care provider. General instructions   Pay attention to any changes in your symptoms. Let your health care provider know about them.  Wear loose-fitting clothing. Do not wear anything tight around your waist that causes pressure on your abdomen.  Raise (elevate) the head of your bed about 6 inches (15 cm).  Try relaxation strategies such as yoga, deep breathing, or meditation to manage stress. If you need help reducing stress, ask your health care provider.  If you are overweight, reduce your weight to an amount that is healthy for you. Ask your health care provider for guidance about a safe weight loss goal.  Keep all follow-up visits as told by your health care provider. This is important. Contact a health care provider if:  You have new symptoms.  You have unexplained weight loss.  You have difficulty swallowing, or it hurts to swallow.  You have wheezing or a cough that does not go away.  Your symptoms do not improve with treatment.  You have frequent heartburn for more than two weeks. Get help right away if:  You have severe pain in your arms, neck, jaw, teeth, or back.  You feel sweaty, dizzy, or light-headed.  You have chest pain or shortness of breath.  You vomit and your vomit looks like blood or coffee grounds.  Your stool is bloody or black.  You have a fever.  You cannot swallow, drink, or eat. Summary  Esophagitis is inflammation of the esophagus.  Most causes of esophagitis are not serious.  Follow your health care provider's instructions about eating  and drinking. Follow instructions on medicines.  Contact a health care provider if you have new symptoms, have weight loss, or coughing that does not stop.  Get help right away if you have severe pain in the arms, neck, jaw, teeth or back, or if you have chest pain, shortness of breath, or fever. This information is not intended to replace advice given to you by your health care provider. Make sure you discuss any questions you have with your health care provider. Document Revised: 01/05/2018 Document Reviewed: 01/05/2018 Elsevier Patient Education  [place]. Dehydration, Elderly  Dehydration is condition in which there is not enough water or other fluids in the body. This happens when a person loses more fluids than he or she takes in. Important body parts cannot work right without the right amount of fluids. Any loss of fluids from the body can cause dehydration. People 68 years of age or older have a higher risk of dehydration than younger adults. This is because in older age, the body:  Is less able to keep the right amount of water.  Does not respond to temperature changes as well.  Does not get a sense of thirst as easily or quickly. Dehydration can be mild, worse, or very  bad. It should be treated right away to keep it from getting very bad. What are the causes? This condition may be caused by:  Conditions that cause loss of water or other fluids, such as: ? Watery poop (diarrhea). ? Vomiting. ? Sweating a lot. ? Peeing (urinating) a lot.  Not drinking enough fluids, especially when you: ? Are ill. ? Are doing things that take a lot of energy to do.  Other illnesses and conditions, such as fever or infection.  Certain medicines, such as medicines that take extra fluid out of the body (diuretics).  Lack of safe drinking water.  Not being able to get enough water and food. What increases the risk? The following factors may make you more likely to develop this  condition:  Having a long-term (chronic) illness that has not been treated the right way, such as: ? An illness that may cause you to pee more, such as diabetes. ? Kidney, heart, or lung disease. ? A condition such as dementia. This affects:  The brain and nervous system.  Thinking.  Feelings.  Being 57 years of age or older.  Having a disability.  Living in a place that is high above the ground or sea (high in altitude). The thinner, drier air causes more fluid loss. What are the signs or symptoms? Symptoms of dehydration depend on how bad it is. Mild or worse dehydration  Thirst.  Dry lips or dry mouth.  Feeling dizzy or light-headed, especially when you stand up from sitting.  Muscle cramps.  Your body making: ? Dark pee (urine). Pee may be the color of tea. ? Less pee than normal. ? Less tears than normal.  Headache. Very bad dehydration  Changes in skin. Skin may: ? Be cold to the touch (clammy). ? Be blotchy or pale. ? Not go back to normal right after you lightly pinch it and let it go.  Little or no tears, pee, or sweat.  Changes in vital signs, such as: ? Fast breathing. ? Low blood pressure. ? Weak pulse. ? Pulse that is more than 100 beats a minute when you are sitting still.  Other changes, such as: ? Feeling very thirsty. ? Eyes that look hollow (sunken). ? Cold hands and feet. ? Being mixed up (confused). ? Being very tired (lethargic) or having trouble waking from sleep. ? Short-term weight loss. ? Loss of consciousness. How is this treated? Treatment for this condition depends on how bad it is. Treatment should start right away. Do not wait until your condition gets very bad. Very bad dehydration is an emergency. You will need to go to a hospital.  Mild or worse dehydration can be treated at home. You may be asked to: ? Drink more fluids. ? Drink an oral rehydration solution (ORS). This drink helps get the right amounts of fluids and  salts and minerals in the blood (electrolytes).  Very bad dehydration can be treated: ? With fluids through an IV tube. ? By getting normal levels of salts and minerals in your blood. This is often done by giving salts and minerals through a tube. The tube is passed through your nose and into your stomach. ? By treating the root cause. Follow these instructions at home: Oral rehydration solution If told by your doctor, drink an ORS:  Make an ORS. Use instructions on the package.  Start by drinking small amounts, about  cup (120 mL) every 5-10 minutes.  Slowly drink more until you have had the  amount that your doctor said to have. Eating and drinking         Drink enough clear fluid to keep your pee pale yellow. If you were told to drink an ORS, finish the ORS first. Then, start slowly drinking other clear fluids. Drink fluids such as: ? Water. Do not drink only water. Doing that can make the salt (sodium) level in your body get too low. ? Water from ice chips you suck on. ? Fruit juice that you have added water to (diluted). ? Low-calorie sports drinks.  Eat foods that have the right amounts of salts and minerals, such as: ? Bananas. ? Oranges. ? Potatoes. ? Tomatoes. ? Spinach.  Do not drink alcohol.  Avoid: ? Drinks that have a lot of sugar. These include:  High-calorie sports drinks.  Fruit juice that you did not add water to.  Soda.  Caffeine. ? Foods that are greasy or have a lot of fat or sugar. General instructions  Take over-the-counter and prescription medicines only as told by your doctor.  Do not take salt tablets. Doing that can make the salt level in your body get too high.  Return to your normal activities as told by your doctor. Ask your doctor what activities are safe for you.  Keep all follow-up visits as told by your doctor. This is important. Contact a doctor if:  You have pain in your belly (abdomen) and the pain: ? Gets worse. ? Stays  in one place.  You have a rash.  You have a stiff neck.  You get angry or annoyed (irritable) more easily than normal.  You are more tired or have a harder time waking than normal.  You feel: ? Weak or dizzy. ? Very thirsty. Get help right away if you have:  Any symptoms of very bad dehydration.  A fever.  A very bad headache.  Symptoms of vomiting, such as: ? Your vomiting gets worse or does not go away. ? Your vomit has blood or green stuff in it. ? You cannot eat or drink without vomiting.  Problems with peeing or pooping (having a bowel movement), such as: ? Watery poop that gets worse or does not go away. ? Blood in your poop (stool). This may cause poop to look black and tarry. ? Not peeing in 6-8 hours. ? Peeing only a small amount of very dark pee in 6-8 hours.  Trouble breathing.  Symptoms that get worse with treatment. These symptoms may be an emergency. Do not wait to see if the symptoms will go away. Get medical help right away. Call your local emergency services (911 in the [place] Do not drive yourself to the hospital. Summary  Dehydration is a condition in which there is not enough water or other fluids in the body. This happens when a person loses more fluids than he or she takes in.  Treatment for this condition depends on how bad it is. Treatment should be started right away. Do not wait until your condition gets very bad.  Drink enough clear fluid to keep your pee pale yellow. If you were told to drink an oral rehydration solution (ORS), finish the ORS first. Then, start slowly drinking other clear fluids.  Take over-the-counter and prescription medicines only as told by your doctor.  Get help right away if you have any symptoms of very bad dehydration. This information is not intended to replace advice given to you by your health care provider. Make sure you discuss  any questions you have with your health care provider. Document Revised: 12/27/2018  Document Reviewed: 12/27/2018 Elsevier Patient Education  [place].  Gastrointestinal Bleeding Gastrointestinal (GI) bleeding is bleeding somewhere along the path that food travels through the body (digestive tract). This path is anywhere between the mouth and the opening of the butt (anus). You may have blood in your poop (stool) or have black poop. If you throw up (vomit), there may be blood in it. This condition can be mild, serious, or even life-threatening. If you have a lot of bleeding, you may need to stay in the hospital. What are the causes? This condition may be caused by:  Irritation and swelling of the esophagus (esophagitis). The esophagus is part of the body that moves food from your mouth to your stomach.  Swollen veins in the butt (hemorrhoids).  Areas of painful tearing in the opening of the butt (anal fissures). These are often caused by passing hard poop.  Pouches that form on the colon over time (diverticulosis).  Irritation and swelling (diverticulitis) in areas where pouches have formed on the colon.  Growths (polyps) or cancer. Colon cancer often starts out as growths that are not cancer.  Irritation of the stomach lining (gastritis).  Sores (ulcers) in the stomach. What increases the risk? You are more likely to develop this condition if you:  Have a certain type of infection in your stomach (Helicobacter pylori infection).  Take certain medicines.  Smoke.  Drink alcohol. What are the signs or symptoms? Common symptoms of this condition include:  Throwing up (vomiting) material that has bright red blood in it. It may look like coffee grounds.  Changes in your poop. The poop may: ? Have red blood in it. ? Be black, look like tar, and smell stronger than normal. ? Be red.  Pain or cramping in the belly (abdomen). How is this treated? Treatment for this condition depends on the cause of the bleeding. For example:  Sometimes, the bleeding  can be stopped during a procedure that is done to find the problem (endoscopy or colonoscopy).  Medicines can be used to: ? Help control irritation, swelling, or infection. ? Reduce acid in your stomach.  Certain problems can be treated with: ? Creams. ? Medicines that are put in the butt (suppositories). ? Warm baths.  Surgery is sometimes needed.  If you lose a lot of blood, you may need a blood transfusion. If bleeding is mild, you may be allowed to go home. If there is a lot of bleeding, you will need to stay in the hospital. Follow these instructions at home:   Take over-the-counter and prescription medicines only as told by your doctor.  Eat foods that have a lot of fiber in them. These foods include beans, whole grains, and fresh fruits and vegetables. You can also try eating 1-3 prunes each day.  Drink enough fluid to keep your pee (urine) pale yellow.  Keep all follow-up visits as told by your doctor. This is important. Contact a doctor if:  Your symptoms do not get better. Get help right away if:  Your bleeding does not stop.  You feel dizzy or you pass out (faint).  You feel weak.  You have very bad cramps in your back or belly.  You pass large clumps of blood (clots) in your poop.  Your symptoms are getting worse.  You have chest pain or fast heartbeats. Summary  GI bleeding is bleeding somewhere along the path that food  travels through the body (digestive tract).  This bleeding can be caused by many things. Treatment depends on the cause of the bleeding.  Take medicines only as told by your doctor.  Keep all follow-up visits as told by your doctor. This is important. This information is not intended to replace advice given to you by your health care provider. Make sure you discuss any questions you have with your health care provider. Document Revised: 12/27/2017 Document Reviewed: 12/27/2017 Elsevier Patient Education  [place].

## 2019-12-19 NOTE — Progress Notes (Signed)
Discharge teaching complete. Meds, diet, activity follow up appointments reviewed and all questions answered. Copy of instructions given to patient and prescriptions sent to pharmacy.

## 2019-12-20 ENCOUNTER — Telehealth: Payer: Self-pay

## 2019-12-20 NOTE — Telephone Encounter (Signed)
Transition Care Management Follow-up Telephone Call  Date of discharge and from where: 12/19/2019 [facility]  How have you been since you were released from the hospital? Better, had a good night sleep  Any questions or concerns? Yes   Items Reviewed:  Did the pt receive and understand the discharge instructions provided? Yes   Medications obtained and verified? Yes   Any new allergies since your discharge? No   Dietary orders reviewed? Yes  Do you have support at home? Yes   Functional Questionnaire: (I = Independent and D = Dependent) ADLs: I  Bathing/Dressing- I  Meal Prep- I  Eating- I  Maintaining continence- I  Transferring/Ambulation- I  Managing Meds- I  Follow up appointments reviewed:   PCP Hospital f/u appt confirmed? Yes  Scheduled to see Dr. Yong Channel  on 12/26/2019 @ 3:40..  Are transportation arrangements needed? No   If their condition worsens, is the pt aware to call PCP or go to the Emergency Dept.? Yes  Was the patient provided with contact information for the PCP's office or ED? Yes  Was to pt encouraged to call back with questions or concerns? Yes

## 2019-12-26 ENCOUNTER — Other Ambulatory Visit: Payer: Self-pay

## 2019-12-26 ENCOUNTER — Ambulatory Visit (INDEPENDENT_AMBULATORY_CARE_PROVIDER_SITE_OTHER): Payer: Medicare HMO | Admitting: Family Medicine

## 2019-12-26 ENCOUNTER — Encounter: Payer: Self-pay | Admitting: Family Medicine

## 2019-12-26 VITALS — BP 118/68 | HR 55 | Temp 98.2°F | Resp 18 | Ht 71.0 in | Wt 150.6 lb

## 2019-12-26 DIAGNOSIS — Z86010 Personal history of colonic polyps: Secondary | ICD-10-CM | POA: Diagnosis not present

## 2019-12-26 DIAGNOSIS — I951 Orthostatic hypotension: Secondary | ICD-10-CM | POA: Diagnosis not present

## 2019-12-26 DIAGNOSIS — K264 Chronic or unspecified duodenal ulcer with hemorrhage: Secondary | ICD-10-CM

## 2019-12-26 DIAGNOSIS — D500 Iron deficiency anemia secondary to blood loss (chronic): Secondary | ICD-10-CM

## 2019-12-26 DIAGNOSIS — E860 Dehydration: Secondary | ICD-10-CM

## 2019-12-26 DIAGNOSIS — R55 Syncope and collapse: Secondary | ICD-10-CM

## 2019-12-26 DIAGNOSIS — E785 Hyperlipidemia, unspecified: Secondary | ICD-10-CM

## 2019-12-26 LAB — COMPREHENSIVE METABOLIC PANEL
AG Ratio: 1.8 (calc) (ref 1.0–2.5)
ALT: 16 U/L (ref 9–46)
AST: 17 U/L (ref 10–35)
Albumin: 4.2 g/dL (ref 3.6–5.1)
Alkaline phosphatase (APISO): 56 U/L (ref 35–144)
BUN: 13 mg/dL (ref 7–25)
CO2: 27 mmol/L (ref 20–32)
Calcium: 9.3 mg/dL (ref 8.6–10.3)
Chloride: 103 mmol/L (ref 98–110)
Creat: 0.97 mg/dL (ref 0.70–1.11)
Globulin: 2.4 g/dL (calc) (ref 1.9–3.7)
Glucose, Bld: 99 mg/dL (ref 65–99)
Potassium: 4.8 mmol/L (ref 3.5–5.3)
Sodium: 139 mmol/L (ref 135–146)
Total Bilirubin: 0.5 mg/dL (ref 0.2–1.2)
Total Protein: 6.6 g/dL (ref 6.1–8.1)

## 2019-12-26 LAB — CBC WITH DIFFERENTIAL/PLATELET
Absolute Monocytes: 663 cells/uL (ref 200–950)
Basophils Absolute: 52 cells/uL (ref 0–200)
Basophils Relative: 0.8 %
Eosinophils Absolute: 241 cells/uL (ref 15–500)
Eosinophils Relative: 3.7 %
HCT: 33.8 % — ABNORMAL LOW (ref 38.5–50.0)
Hemoglobin: 11.2 g/dL — ABNORMAL LOW (ref 13.2–17.1)
Lymphs Abs: 1801 cells/uL (ref 850–3900)
MCH: 32.9 pg (ref 27.0–33.0)
MCHC: 33.1 g/dL (ref 32.0–36.0)
MCV: 99.4 fL (ref 80.0–100.0)
MPV: 9.6 fL (ref 7.5–12.5)
Monocytes Relative: 10.2 %
Neutro Abs: 3744 cells/uL (ref 1500–7800)
Neutrophils Relative %: 57.6 %
Platelets: 317 10*3/uL (ref 140–400)
RBC: 3.4 10*6/uL — ABNORMAL LOW (ref 4.20–5.80)
RDW: 12.8 % (ref 11.0–15.0)
Total Lymphocyte: 27.7 %
WBC: 6.5 10*3/uL (ref 3.8–10.8)

## 2019-12-26 NOTE — Progress Notes (Signed)
Phone [phone]   Subjective:  Brian Welch is a 86 y.o. year old very pleasant male patient who presents for transitional care management and hospital follow up for GI bleed related to duodenal ulcer. Patient was hospitalized from 12/16/2019 to 12/19/2019. A TCM phone call was completed on 12/20/2019. Medical complexity moderate  Discharge summary reviewed and summarized as below.  Prior to admission patient noted fatigue, weakness, lightheadedness.  He also had an episode of nausea and vomiting x2 at home-patient later reported hematemesis but this was not initially reported.  Prior to these episodes he has been working vigorously in his vegetable garden for a few days, not eating well or drinking enough fluids-he also had been riding his bicycle to distribute some of the vegetables to his neighbors.  Initially patient's concerns were thought to be related to dehydration.  In the emergency room he was orthostatic and required 2 L of normal saline.  Initially had leukocytosis to 14,000 (ultimately thought to be related to stress demargination/reactive in nature), BUN/creatinine ratio greater than 30: 1.  COVID-19 testing was negative he later developed an acute anemia and was noted to have positive fecal occult blood test.  Hemoglobin dropped from 13,000 initially to 10.1 thousand.  Due to discovery of hematemesis as part of history in addition to significant anemia -patient was taken for emergent EGD on December 17, 2019 by Dr. Henrene Pastor.  Patient was found to have a large duodenal ulcer which required epinephrine injection.  It did not appear to be actively bleeding at time of evaluation.  Patient also had significant esophageal stricture and esophagitis.  Interestingly H. pylori was negative and patient has no history of NSAID use.  Thankfully after EGD hemoglobin stabilized around 9.  Patient was started on Protonix 40 mg IV while hospitalized and transitioned to 40 mg p.o. twice daily for 30 days  outpatient with plan to transition to 40 mg daily after that.  He was advised to continue to avoid NSAIDs.  There was a note of a biopsy pending but I see no evidence of biopsy.  Plan was for outpatient GI follow-up-I have confirmed that patient is scheduled on August 31 with GI-we also discussed importance of following through with this visit.  Patient may need repeat EGD versus colonoscopy-await GIs opinion.  Of note-patient not on any blood thinners or antiplatelets such as aspirin  Ultimately patient was prior presyncope/orthostatic hypotension was thought to be related to a combination of acute GI bleed and dehydration.  Orthostatic hypotension resolved with fluids and stabilizing GI bleed.  He did have other work-up including CT of the head which was unremarkable for stroke or mass as well as chest x-ray without obvious infectious cause..  Chest x-ray reviewed below.  Of note patient also had hyperglycemia which was thought to be reactive-hemoglobin A1c was not elevated at 5.2 on December 18, 2019.  Patient feels like he is improving and regaining strength.  He has mowed his lawn outside of the heat of the day-painful he has been more cautious.  He has also been able to do some push-ups-wife asks about how quickly he can restart his regular activities.  He did have mild abnormalities at discharge including slightly low potassium at 3.4, slightly low protein and albumin on CMP; hemoglobin was 9.3 before discharge.  My independent review of imaging  1. chest x-ray 12/16/2019-only 1 view noted.  No bony abnormalities.  Some calcification/likely atherosclerosis of thoracic aorta.  Appears to have chronic changes at lung bases  concerning for scarring.  Otherwise no obvious opacification suggesting pneumonia.  Slight elevation of right hemidiaphragm 2.  Head CT 12/16/2019-no significant bony abnormalities, no evidence of mass, hemorrhage, obvious stroke.  Mild cerebral atrophy consistent with age as well as some  mild chronic small vessel ischemic disease noted as well as atherosclerosis of vessels at skull base.  Sinuses largely  normal other than small amount of fluid in left sphenoid sinus. Lab Results  Component Value Date   [id number] 409 12/18/2019      See problem oriented charting as well  Past Medical History-  Patient Active Problem List   Diagnosis Date Noted  . Acute blood loss anemia     Priority: High  . Duodenal ulcer hemorrhagic     Priority: High  . Pre-syncope 12/16/2019    Priority: Medium  . Hyperlipidemia 07/17/2019    Priority: Medium  . History of colonic polyps 06/16/2014    Priority: Medium  . History of basal cell carcinoma 06/16/2014    Priority: Low    Medications- reviewed and updated  A medical reconciliation was performed comparing current medicines to hospital discharge medications. Current Outpatient Medications  Medication Sig Dispense Refill  . Cholecalciferol (VITAMIN D-3 PO) Take 1 tablet by mouth daily.     Marland Kitchen co-enzyme Q-10 30 MG capsule Take 30 mg by mouth daily.     . Garlic 10 MG CAPS Take 1 capsule by mouth daily.     . Multiple Vitamins-Minerals (EYE VITAMINS PO) Take 1 tablet by mouth daily.     . Omega-3 Fatty Acids (FISH OIL) 1000 MG CPDR Take 1 capsule by mouth daily.     . pantoprazole (PROTONIX) 40 MG tablet Take 1 tablet (40 mg total) by mouth 2 (two) times daily. 60 tablet 0  . [START ON 01/18/2020] pantoprazole (PROTONIX) 40 MG tablet Take 1 tablet (40 mg total) by mouth daily. 90 tablet 0  . RESVERATROL 100 MG CAPS Take 1 capsule by mouth daily.     . ondansetron (ZOFRAN ODT) 4 MG disintegrating tablet Take 1 tablet (4 mg total) by mouth every 6 (six) hours as needed. (Patient not taking: Reported on 12/26/2019) 20 tablet 0   No current facility-administered medications for this visit.   Objective  Objective:  BP 118/68   Pulse 55   Temp 98.2 F (36.8 C) (Temporal)   Resp 18   Ht 5\' 11"  (1.803 m)   Wt 150 lb 9.6 oz (68.3 kg)    SpO2 94%   BMI 21.00 kg/m  Gen: NAD, resting comfortably, well-appearing CV: Bradycardic but regular no murmurs rubs or gallops Lungs: CTAB no crackles, wheeze, rhonchi Abdomen: soft/nontender/nondistended/normal bowel sounds. No rebound or guarding.  Ext: no edema Skin: warm, dry    Assessment and Plan:   TCM visit  1. Blood loss anemia/large duodenal ulcer/large caliber esophageal stricture and esophagitis Patient initially thought to simply be dehydrated but later developed significant drop in hemoglobin up over 3 points from 13-10 and required emergent EGD-large duodenal ulceration noted as above which was treated with epinephrine.  Hemoglobin stabilized around 9 before discharge. -We will repeat CBC today to assure hemoglobin stable.  I will plan to forward CBC to GI for care coordination -Also check CMP given mild hypokalemia before discharge -Encourage patient to continue tonics 40 mg twice daily until completes prescribed #60 and then transition to once daily-we discussed/educated patient on how reducing acid levels helps ulcerations heal and prevent recurrence -He may need long-term PPI  due to severity of this episode -May need follow-up EGD.  Once again I verified patient had appointment with GI and plans to keep this.  -They were given esophagitis diet on discharge paperwork but has been told he did not need a specific diet-since he is not having any symptoms of reflux or ulceration at this time-I told him I would be okay with him continuing his baseline diet-his main concern is wanting to be able to eat foods from his garden-I did not restrict him at this time  2. Pre-syncope/dehydration/orthostatic hypotension Patient reports doing much better job staying hydrated at home.  We will check CMP to evaluate BUN to creatinine ratio to confirm this.  I do not think patient needs further cardiac work-up in regards to syncope.  I reviewed his head CT and chest x-ray which were  largely reassuring  3. Hyperlipidemia, unspecified hyperlipidemia type Patient does have mild untreated hyperlipidemia-I do not think this contributed to his episode.  I would not recommend statin for primary prevention at his age Lab Results  Component Value Date   CHOL 172 07/17/2019   HDL 40.30 07/17/2019   LDLCALC 100 (H) 07/17/2019   TRIG 162.0 (H) 07/17/2019   CHOLHDL 4 07/17/2019    4. History of colonic polyps -With anemia and fecal occult blood testing positive in prior colonoscopy in 2010 with serrated adenoma-would ask GIs opinion on 1 final colonoscopy  Recommended follow up: We discussed February physical or sooner if needed Future Appointments  Date Time Provider [place]  01/28/2020  9:30 AM Zehr, Tollie Eth [facility]  07/17/2020 10:40 AM Yong Channel Brayton Mars, MD [facility]    Lab/Order associations:   ICD-10-CM   1. Blood loss anemia  D50.0 CBC with Differential/Platelet    Comprehensive metabolic panel    CBC with Differential/Platelet    Comprehensive metabolic panel  2. Duodenal ulcer hemorrhagic  K26.4   3. Pre-syncope  R55   4. Hyperlipidemia, unspecified hyperlipidemia type  E78.5   5. History of colonic polyps  Z86.010   6. Dehydration  E86.0   7. Orthostatic hypotension  I95.1    Return precautions advised.  Garret Reddish, MD

## 2019-12-26 NOTE — Patient Instructions (Addendum)
We are going to check labs today to make sure that your levels have improved from hospital.   Make sure you are doing everything you can to stay hydrated. We will check your electrolytes today.    Keep follow up with Gastroenterology.    If you start to have any new symptoms let our office know.   If you have any questions or concerns before your next scheduled appointment please give our office a call.      Recommended follow up: No follow-ups on file.

## 2019-12-31 ENCOUNTER — Telehealth: Payer: Self-pay | Admitting: Family Medicine

## 2019-12-31 NOTE — Telephone Encounter (Signed)
Noted  

## 2019-12-31 NOTE — Telephone Encounter (Signed)
Patient has called in requesting results of recent labs.  I have given patient Dr. Ansel Welch response on labs.  Patient understands.  He was very excited that the labs have improved.

## 2020-01-28 ENCOUNTER — Ambulatory Visit: Payer: Medicare HMO | Admitting: Gastroenterology

## 2020-01-28 ENCOUNTER — Encounter: Payer: Self-pay | Admitting: Gastroenterology

## 2020-01-28 VITALS — BP 122/78 | HR 70 | Ht 71.0 in | Wt 156.4 lb

## 2020-01-28 DIAGNOSIS — K264 Chronic or unspecified duodenal ulcer with hemorrhage: Secondary | ICD-10-CM

## 2020-01-28 NOTE — Progress Notes (Signed)
01/28/2020 MAVERIK FOOT [id number] [birth year]   HISTORY OF PRESENT ILLNESS: This is an 86 year old male with limited past medical history who is here for hospital follow-up.  He was hospitalized from July 19 to July 22 after presenting with weakness, presyncope, nausea, and hematemesis.  Hemoglobin drifted from 13 g down to 10.1 g.  Passed some bloody stool while in the hospital.  Underwent EGD with Dr. Henrene Pastor on July 20 showed a large duodenal ulcer, not actively bleeding or high risk stigmata.  S/p epinephrine injection. Large caliber esophageal stricture and esophagitis. Antral erosions. Results of CLO biopsy were negative.  He was maintained on pantoprazole 40 mg twice daily for a month and is now down to once daily.  He was not on any type of PPI upon presentation.  Does not use NSAIDs.  As stated above H. pylori was negative.  He says that he feels great.  No further sign of bleeding.  Last hemoglobin was rechecked on July 29 by his PCP and was 11.2 g.  Last colonoscopy with Dr. Fuller Plan was in December 2010 at which time he was found to have a couple of small polyps removed as well as mild diverticulosis in the sigmoid colon.  One polyp was a serrated adenoma and the others were hyperplastic polyps.    Past Medical History:  Diagnosis Date  . History of basal cell carcinoma 06/16/2014   2013 Left ear   . History of colonic polyps 06/16/2014   Adenoma 2010. Advised 5 year follow up.     Past Surgical History:  Procedure Laterality Date  . BIOPSY  12/17/2019   Procedure: BIOPSY;  Surgeon: Irene Shipper, MD;  Location: [facility] ENDOSCOPY;  Service: Endoscopy;;  . ESOPHAGOGASTRODUODENOSCOPY (EGD) WITH PROPOFOL N/A 12/17/2019   Procedure: ESOPHAGOGASTRODUODENOSCOPY (EGD) WITH PROPOFOL;  Surgeon: Irene Shipper, MD;  Location: [facility] ENDOSCOPY;  Service: Endoscopy;  Laterality: N/A;  . HEMOSTASIS CLIP PLACEMENT  12/17/2019   Procedure: HEMOSTASIS CLIP PLACEMENT;  Surgeon: Irene Shipper, MD;   Location: [facility] ENDOSCOPY;  Service: Endoscopy;;  . HEMOSTASIS CONTROL  12/17/2019   Procedure: HEMOSTASIS CONTROL;  Surgeon: Irene Shipper, MD;  Location: [facility] ENDOSCOPY;  Service: Endoscopy;;  . left eye cataract removal      reports that he quit smoking about 46 years ago. His smoking use included cigarettes. He has a 15.00 pack-year smoking history. He has never used smokeless tobacco. He reports that he does not drink alcohol and does not use drugs. family history includes Stroke in his mother; Tuberculosis in his father. No Known Allergies    Outpatient Encounter Medications as of 01/28/2020  Medication Sig  . Cholecalciferol (VITAMIN D-3 PO) Take 1 tablet by mouth daily.   Marland Kitchen co-enzyme Q-10 30 MG capsule Take 30 mg by mouth daily.   . Garlic 10 MG CAPS Take 1 capsule by mouth daily.   . Multiple Vitamins-Minerals (EYE VITAMINS PO) Take 1 tablet by mouth daily.   . Omega-3 Fatty Acids (FISH OIL) 1000 MG CPDR Take 1 capsule by mouth daily.   . pantoprazole (PROTONIX) 40 MG tablet Take 1 tablet (40 mg total) by mouth daily.  Marland Kitchen RESVERATROL 100 MG CAPS Take 1 capsule by mouth daily.   . pantoprazole (PROTONIX) 40 MG tablet Take 1 tablet (40 mg total) by mouth 2 (two) times daily.  . [DISCONTINUED] ondansetron (ZOFRAN ODT) 4 MG disintegrating tablet Take 1 tablet (4 mg total) by mouth every 6 (six) hours as needed. (Patient  not taking: Reported on 12/26/2019)   No facility-administered encounter medications on file as of 01/28/2020.     REVIEW OF SYSTEMS  : All other systems reviewed and negative except where noted in the History of Present Illness.   PHYSICAL EXAM: BP 122/78 (BP Location: Left Arm, Patient Position: Sitting, Cuff Size: Normal)   Pulse 70   Ht 5\' 11"  (1.803 m)   Wt 156 lb 6 oz (70.9 kg)   BMI 21.81 kg/m  General: Well developed white male in no acute distress Head: Normocephalic and atraumatic Eyes:  Sclerae anicteric, conjunctiva pink. Ears: Normal auditory  acuity Lungs: Clear throughout to auscultation; no increased WOB. Heart: Regular rate and rhythm; no M/R/G. Abdomen: Soft, non-distended.  BS present.  Non-tender. Musculoskeletal: Symmetrical with no gross deformities  Skin: No lesions on visible extremities Extremities: No edema  Neurological: Alert oriented x 4, grossly non-focal Psychological:  Alert and cooperative. Normal mood and affect  ASSESSMENT AND PLAN: *UGI bleed:  12/17/19 ZVJ:KQASU duodenal ulcer, not actively bleeding or high risk stigmata.  S/p epinephrine injection. Large caliber esophageal stricture and esophagitis. Antral erosions. Results of CLO biopsy were negative.  No further signs of bleeding.  Continue pantoprazole 40 mg daily indefinitely.  No need for repeat EGD at this time.  *Anemia.Macrocytosis.Likely blood loss anemia. Hgb dropped from 13 >> 8.7 >> 9.3.  Improved to 11.2 grams when check by PCP on 7/29.  *Serrated adenomatous and HP polyps on 04/2009 colonoscopy by Dr. Elza Rafter year follow-up recommended buthas not had follow-up colonoscopy.  I think that at his age we can defer surveillance colonoscopy.  He is not having any issues.   CC:  [name], MD

## 2020-01-28 NOTE — Progress Notes (Signed)
Reviewed and agree with management plan.  Brandilynn Taormina T. Codylee Patil, MD FACG [facility]  

## 2020-01-28 NOTE — Patient Instructions (Addendum)
If you are age 84 or older, your body mass index should be between 23-30. Your Body mass index is 21.81 kg/m. If this is out of the aforementioned range listed, please consider follow up with your Primary Care Provider.  If you are age 71 or younger, your body mass index should be between 19-25. Your Body mass index is 21.81 kg/m. If this is out of the aformentioned range listed, please consider follow up with your Primary Care Provider.   Follow up as needed.

## 2020-02-26 ENCOUNTER — Ambulatory Visit: Payer: Medicare HMO

## 2020-02-26 ENCOUNTER — Ambulatory Visit (INDEPENDENT_AMBULATORY_CARE_PROVIDER_SITE_OTHER): Payer: Medicare HMO

## 2020-02-26 ENCOUNTER — Other Ambulatory Visit: Payer: Self-pay

## 2020-02-26 DIAGNOSIS — Z23 Encounter for immunization: Secondary | ICD-10-CM | POA: Diagnosis not present

## 2020-03-03 DIAGNOSIS — H35372 Puckering of macula, left eye: Secondary | ICD-10-CM | POA: Diagnosis not present

## 2020-03-03 DIAGNOSIS — H2511 Age-related nuclear cataract, right eye: Secondary | ICD-10-CM | POA: Diagnosis not present

## 2020-03-03 DIAGNOSIS — H5212 Myopia, left eye: Secondary | ICD-10-CM | POA: Diagnosis not present

## 2020-03-23 ENCOUNTER — Other Ambulatory Visit: Payer: Self-pay | Admitting: Family Medicine

## 2020-03-23 MED ORDER — PANTOPRAZOLE SODIUM 40 MG PO TBEC: 40.0000 mg | DELAYED_RELEASE_TABLET | Freq: Every day | ORAL | 3 refills | Status: DC

## 2020-04-16 DIAGNOSIS — R69 Illness, unspecified: Secondary | ICD-10-CM | POA: Diagnosis not present

## 2020-05-19 DIAGNOSIS — R69 Illness, unspecified: Secondary | ICD-10-CM | POA: Diagnosis not present

## 2020-07-16 NOTE — Patient Instructions (Incomplete)
Please stop by lab before you go If you have mychart- we will send your results within 3 business days of [place] receiving them.  If you do not have mychart- we will call you about results within 5 business days of [place] receiving them.  *please also note that you will see labs on mychart as soon as they post. I will later go in and write notes on them- will say "notes from Dr. Yong Channel"  You are eligible to schedule your annual wellness visit with our nurse specialist Otila Kluver.  Please consider scheduling this before you leave today  Health Maintenance Due  Topic Date Due  . COVID-19 Vaccine (3 - Pfizer risk 4-dose series) Patient will call back with date.  08/26/2019   Recommended follow up: Return in about 1 year (around 07/17/2021) for physical or sooner if needed.

## 2020-07-16 NOTE — Progress Notes (Signed)
Phone: [phone]   Subjective:  Patient presents today for their annual physical. Chief complaint-noted.   See problem oriented charting- ROS- full  review of systems was completed and negative  except for: seldom sneezing  The following were reviewed and entered/updated in epic: Past Medical History:  Diagnosis Date  . History of basal cell carcinoma 06/16/2014   2013 Left ear   . History of colonic polyps 06/16/2014   Adenoma 2010. Advised 5 year follow up.     Patient Active Problem List   Diagnosis Date Noted  . Acute blood loss anemia     Priority: High  . Duodenal ulcer hemorrhagic     Priority: High  . Pre-syncope 12/16/2019    Priority: Medium  . Hyperlipidemia 07/17/2019    Priority: Medium  . History of colonic polyps 06/16/2014    Priority: Medium  . History of basal cell carcinoma 06/16/2014    Priority: Low   Past Surgical History:  Procedure Laterality Date  . BIOPSY  12/17/2019   Procedure: BIOPSY;  Surgeon: Irene Shipper, MD;  Location: [facility] ENDOSCOPY;  Service: Endoscopy;;  . ESOPHAGOGASTRODUODENOSCOPY (EGD) WITH PROPOFOL N/A 12/17/2019   Procedure: ESOPHAGOGASTRODUODENOSCOPY (EGD) WITH PROPOFOL;  Surgeon: Irene Shipper, MD;  Location: [facility] ENDOSCOPY;  Service: Endoscopy;  Laterality: N/A;  . HEMOSTASIS CLIP PLACEMENT  12/17/2019   Procedure: HEMOSTASIS CLIP PLACEMENT;  Surgeon: Irene Shipper, MD;  Location: [place];  Service: Endoscopy;;  . HEMOSTASIS CONTROL  12/17/2019   Procedure: HEMOSTASIS CONTROL;  Surgeon: Irene Shipper, MD;  Location: [facility] ENDOSCOPY;  Service: Endoscopy;;  . left eye cataract removal      Family History  Problem Relation Age of Onset  . Stroke Mother        76, nonsmoker. lived to 83  . Tuberculosis Father        died 3 months before patient born    Medications- reviewed and updated Current Outpatient Medications  Medication Sig Dispense Refill  . Cholecalciferol (VITAMIN D-3 PO) Take 1 tablet by mouth daily.     Marland Kitchen  co-enzyme Q-10 30 MG capsule Take 30 mg by mouth daily.     . Garlic 10 MG CAPS Take 1 capsule by mouth daily.     . Multiple Vitamins-Minerals (EYE VITAMINS PO) Take 1 tablet by mouth daily.     . Omega-3 Fatty Acids (FISH OIL) 1000 MG CPDR Take 1 capsule by mouth daily.     . pantoprazole (PROTONIX) 40 MG tablet Take 1 tablet (40 mg total) by mouth daily. 90 tablet 3  . RESVERATROL 100 MG CAPS Take 1 capsule by mouth daily.      No current facility-administered medications for this visit.    Allergies-reviewed and updated No Known Allergies  Social History   Social History Narrative   Married (wife Arbie Cookey patient of Dr. Yong Channel). 1 son. No grandchildren.       Retired from Public relations account executive: exercise, see movies, plays and musicals   Objective  Objective:  BP 136/68   Pulse 64   Temp 97.9 F (36.6 C) (Temporal)   Ht '5\' 11"'  (1.803 m)   Wt 161 lb (73 kg)   SpO2 96%   BMI 22.45 kg/m  Gen: NAD, resting comfortably HEENT: Mucous membranes are moist. Oropharynx normal. Oropharynx normal. Removed cerumen impaction from left egg with curette Neck: no thyromegaly CV: RRR no murmurs rubs or gallops Lungs: CTAB no crackles, wheeze, rhonchi Abdomen: soft/nontender/nondistended/normal bowel  sounds. No rebound or guarding.  Ext: no edema Skin: warm, dry Neuro: grossly normal, moves all extremities, PERRLA   Assessment and Plan  87 y.o. male presenting for annual physical.  Health Maintenance counseling: 1. Anticipatory guidance: Patient counseled regarding regular dental exams -q6 months, eye exams -yearly,  avoiding smoking and second hand smoke , limiting alcohol to 2 beverages per day - does not drink.   2. Risk factor reduction:  Advised patient of need for regular exercise and diet rich and fruits and vegetables to reduce risk of heart attack and stroke. Exercise- still doing walking/push ups and 35 lb kettle bell- have advised caution with this - still very  active- even helped replace 100 feet of neighbors fence- amazing at his age! Diet- reasonably healthy diet Wt Readings from Last 3 Encounters:  07/17/20 161 lb (73 kg)  01/28/20 156 lb 6 oz (70.9 kg)  12/26/19 150 lb 9.6 oz (68.3 kg)  3. Immunizations/screenings/ancillary studies- covid booster - will call with date Immunization History  Administered Date(s) Administered  . Fluad Quad(high Dose 65+) 02/19/2019, 02/26/2020  . Influenza Split 03/13/2012  . Influenza Whole 04/06/2007, 03/11/2008, 03/23/2009, 02/17/2010  . Influenza, High Dose Seasonal PF 02/16/2017, 02/15/2018  . Influenza,inj,Quad PF,6+ Mos 02/19/2013, 02/10/2014, 03/16/2016  . Influenza-Unspecified 02/25/2015  . PFIZER(Purple Top)SARS-COV-2 Vaccination 07/04/2019, 07/29/2019  . Pneumococcal Conjugate-13 06/17/2014  . Pneumococcal Polysaccharide-23 02/17/2004  . Td 01/29/1999  . Tdap 10/12/2011  . Zoster 05/30/2008  . Zoster Recombinat (Shingrix) 11/20/2019, 05/01/2020  4. Prostate cancer screening-  past age based screening recs  5. Colon cancer screening -  referred back to GI in 2018 due to history adenomatous polyps- since over age 58 decision was later made not to pursue repeat colonoscopy in last visit with GI 12/2019 6. Skin cancer screening- no dermatologist- has declined referral in past. advised regular sunscreen use. Denies worrisome, changing, or new skin lesions.  7. former smoker- quit in 1970s- no regular screeningrequired 8. STD screening - only active with wife  Status of chronic or acute concerns   # social update-  wife Arbie Cookey doing reasonably well with multiple myeloma- he is caregiving for her.   #hyperlipidemia S: Medication: fish oil 1000Mg, co q10 30Mg Lab Results  Component Value Date   CHOL 172 07/17/2019   HDL 40.30 07/17/2019   LDLCALC 100 (H) 07/17/2019   TRIG 162.0 (H) 07/17/2019   CHOLHDL 4 07/17/2019   A/P: mild elevation- will monitor with labs but above age 64 do not feel strongly  about statin for priray prevention  # history of blood loss anemia/large duodenal ulcer/large caliber esophageal stricture with esophagitis-  discussed likely would prefer long term PPI with severity of symptoms- per GI note on 01/18/20 continue indefinitely.  - with long term PPI use will check b12  -did have some mild anemia last check- hopefully improving  Recommended follow up: Return in about 1 year (around 07/17/2021) for physical or sooner if needed.  Lab/Order associations:NOT  fasting   ICD-10-CM   1. Preventative health care  Z00.00   2. Hyperlipidemia, unspecified hyperlipidemia type  E78.5   3. High risk medication use  Z79.899     No orders of the defined types were placed in this encounter.   Return precautions advised.  Garret Reddish, MD

## 2020-07-17 ENCOUNTER — Encounter: Payer: Self-pay | Admitting: Family Medicine

## 2020-07-17 ENCOUNTER — Ambulatory Visit (INDEPENDENT_AMBULATORY_CARE_PROVIDER_SITE_OTHER): Payer: Medicare HMO | Admitting: Family Medicine

## 2020-07-17 ENCOUNTER — Other Ambulatory Visit: Payer: Self-pay

## 2020-07-17 VITALS — BP 136/68 | HR 64 | Temp 97.9°F | Ht 71.0 in | Wt 161.0 lb

## 2020-07-17 DIAGNOSIS — Z79899 Other long term (current) drug therapy: Secondary | ICD-10-CM

## 2020-07-17 DIAGNOSIS — E785 Hyperlipidemia, unspecified: Secondary | ICD-10-CM | POA: Diagnosis not present

## 2020-07-17 DIAGNOSIS — Z0000 Encounter for general adult medical examination without abnormal findings: Secondary | ICD-10-CM | POA: Diagnosis not present

## 2020-07-17 LAB — CBC WITH DIFFERENTIAL/PLATELET
Basophils Absolute: 0.1 10*3/uL (ref 0.0–0.1)
Basophils Relative: 1.1 % (ref 0.0–3.0)
Eosinophils Absolute: 0.4 10*3/uL (ref 0.0–0.7)
Eosinophils Relative: 5.1 % — ABNORMAL HIGH (ref 0.0–5.0)
HCT: 37.8 % — ABNORMAL LOW (ref 39.0–52.0)
Hemoglobin: 12.5 g/dL — ABNORMAL LOW (ref 13.0–17.0)
Lymphocytes Relative: 23.7 % (ref 12.0–46.0)
Lymphs Abs: 1.7 10*3/uL (ref 0.7–4.0)
MCHC: 33.1 g/dL (ref 30.0–36.0)
MCV: 82.4 fl (ref 78.0–100.0)
Monocytes Absolute: 0.7 10*3/uL (ref 0.1–1.0)
Monocytes Relative: 9.2 % (ref 3.0–12.0)
Neutro Abs: 4.4 10*3/uL (ref 1.4–7.7)
Neutrophils Relative %: 60.9 % (ref 43.0–77.0)
Platelets: 242 10*3/uL (ref 150.0–400.0)
RBC: 4.59 Mil/uL (ref 4.22–5.81)
RDW: 18.1 % — ABNORMAL HIGH (ref 11.5–15.5)
WBC: 7.2 10*3/uL (ref 4.0–10.5)

## 2020-07-17 LAB — COMPREHENSIVE METABOLIC PANEL
ALT: 21 U/L (ref 0–53)
AST: 28 U/L (ref 0–37)
Albumin: 4.3 g/dL (ref 3.5–5.2)
Alkaline Phosphatase: 59 U/L (ref 39–117)
BUN: 13 mg/dL (ref 6–23)
CO2: 26 mEq/L (ref 19–32)
Calcium: 9.4 mg/dL (ref 8.4–10.5)
Chloride: 105 mEq/L (ref 96–112)
Creatinine, Ser: 0.96 mg/dL (ref 0.40–1.50)
GFR: 71.55 mL/min (ref >60.00)
Glucose, Bld: 105 mg/dL — ABNORMAL HIGH (ref 70–99)
Potassium: 4.5 mEq/L (ref 3.5–5.1)
Sodium: 138 mEq/L (ref 135–145)
Total Bilirubin: 0.5 mg/dL (ref 0.2–1.2)
Total Protein: 7.1 g/dL (ref 6.0–8.3)

## 2020-07-17 LAB — LIPID PANEL
Cholesterol: 177 mg/dL (ref 0–200)
HDL: 53.5 mg/dL (ref >39.00)
LDL Cholesterol: 85 mg/dL (ref 0–99)
NonHDL: 123.81
Total CHOL/HDL Ratio: 3
Triglycerides: 192 mg/dL — ABNORMAL HIGH (ref 0.0–149.0)
VLDL: 38.4 mg/dL (ref 0.0–40.0)

## 2020-07-17 LAB — VITAMIN B12: Vitamin B-12: 502 pg/mL (ref 211–911)

## 2020-09-14 ENCOUNTER — Ambulatory Visit (INDEPENDENT_AMBULATORY_CARE_PROVIDER_SITE_OTHER): Payer: Medicare HMO

## 2020-09-14 ENCOUNTER — Other Ambulatory Visit: Payer: Self-pay

## 2020-09-14 VITALS — BP 124/60 | HR 61 | Temp 97.7°F | Wt 154.0 lb

## 2020-09-14 DIAGNOSIS — Z0000 Encounter for general adult medical examination without abnormal findings: Secondary | ICD-10-CM

## 2020-09-14 NOTE — Progress Notes (Signed)
Subjective:   Brian Welch is a 87 y.o. male who presents for Medicare Annual/Subsequent preventive examination.  Review of Systems     Cardiac Risk Factors include: advanced age (>84men, >24 women);male gender;dyslipidemia     Objective:    Today's Vitals   09/14/20 1343  BP: 124/60  Pulse: 61  Temp: 97.7 F (36.5 C)  SpO2: 97%  Weight: 154 lb (69.9 kg)   Body mass index is 21.48 kg/m.  Advanced Directives 09/14/2020 07/04/2019  Does Patient Have a Medical Advance Directive? Yes Yes  Type of Advance Directive Healthcare Power of [place]  Does patient want to make changes to medical advance directive? - No - Patient declined  Copy of [place] in Chart? No - copy requested No - copy requested    Current Medications (verified) Outpatient Encounter Medications as of 09/14/2020  Medication Sig  . Cholecalciferol (VITAMIN D-3 PO) Take 1 tablet by mouth daily.   Marland Kitchen co-enzyme Q-10 30 MG capsule Take 30 mg by mouth daily.   . Garlic 10 MG CAPS Take 1 capsule by mouth daily.   . Multiple Vitamins-Minerals (EYE VITAMINS PO) Take 1 tablet by mouth daily.   . Omega-3 Fatty Acids (FISH OIL) 1000 MG CPDR Take 1 capsule by mouth daily.   Marland Kitchen RESVERATROL 100 MG CAPS Take 1 capsule by mouth daily.   . pantoprazole (PROTONIX) 40 MG tablet Take 1 tablet (40 mg total) by mouth daily.  Marland Kitchen SHINGRIX injection    No facility-administered encounter medications on file as of 09/14/2020.    Allergies (verified) Patient has no known allergies.   History: Past Medical History:  Diagnosis Date  . History of basal cell carcinoma 06/16/2014   2013 Left ear   . History of colonic polyps 06/16/2014   Adenoma 2010. Advised 5 year follow up.     Past Surgical History:  Procedure Laterality Date  . BIOPSY  12/17/2019   Procedure: BIOPSY;  Surgeon: Irene Shipper, MD;  Location: [facility] ENDOSCOPY;  Service: Endoscopy;;  .  ESOPHAGOGASTRODUODENOSCOPY (EGD) WITH PROPOFOL N/A 12/17/2019   Procedure: ESOPHAGOGASTRODUODENOSCOPY (EGD) WITH PROPOFOL;  Surgeon: Irene Shipper, MD;  Location: [facility] ENDOSCOPY;  Service: Endoscopy;  Laterality: N/A;  . HEMOSTASIS CLIP PLACEMENT  12/17/2019   Procedure: HEMOSTASIS CLIP PLACEMENT;  Surgeon: Irene Shipper, MD;  Location: [facility];  Service: Endoscopy;;  . HEMOSTASIS CONTROL  12/17/2019   Procedure: HEMOSTASIS CONTROL;  Surgeon: Irene Shipper, MD;  Location: [facility] ENDOSCOPY;  Service: Endoscopy;;  . left eye cataract removal     Family History  Problem Relation Age of Onset  . Stroke Mother        51, nonsmoker. lived to 60  . Tuberculosis Father        died 3 months before patient born   Social History   Socioeconomic History  . Marital status: Married    Spouse name: Not on file  . Number of children: 1  . Years of education: Not on file  . Highest education level: Not on file  Occupational History  . Occupation: Retired     Comment: Fish farm manager  . Smoking status: Former Smoker    Packs/day: 1.00    Years: 15.00    Pack years: 15.00    Types: Cigarettes    Quit date: 09/07/1973    Years since quitting: 47.0  . Smokeless tobacco: Never Used  Vaping Use  . Vaping  Use: Never used  Substance and Sexual Activity  . Alcohol use: No    Alcohol/week: 0.0 standard drinks  . Drug use: No  . Sexual activity: Not on file  Other Topics Concern  . Not on file  Social History Narrative   Married (wife Brian Welch patient of Dr. Yong Channel). 1 son. No grandchildren.       Retired from Public relations account executive: exercise, see movies, plays and musicals   Social Determinants of Health   Financial Resource Strain: Low Risk   . Difficulty of Paying Living Expenses: Not hard at all  Food Insecurity: No Food Insecurity  . Worried About Charity fundraiser in the Last Year: Never true  . Ran Out of Food in the Last Year: Never true  Transportation  Needs: No Transportation Needs  . Lack of Transportation (Medical): No  . Lack of Transportation (Non-Medical): No  Physical Activity: Insufficiently Active  . Days of Exercise per Week: 3 days  . Minutes of Exercise per Session: 20 min  Stress: No Stress Concern Present  . Feeling of Stress : Not at all  Social Connections: Moderately Isolated  . Frequency of Communication with Friends and Family: Twice a week  . Frequency of Social Gatherings with Friends and Family: Never  . Attends Religious Services: More than 4 times per year  . Active Member of Clubs or Organizations: No  . Attends Archivist Meetings: Never  . Marital Status: Married    Tobacco Counseling Counseling given: Not Answered   Clinical Intake:  Pre-visit preparation completed: Yes  Pain : No/denies pain     BMI - recorded: 21.48 Nutritional Status: BMI of 19-24  Normal Nutritional Risks: None Diabetes: No  How often do you need to have someone help you when you read instructions, pamphlets, or other written materials from your doctor or pharmacy?: 1 - Never  Diabetic?No  Interpreter Needed?: No  Information entered by :: Charlott Rakes, LPN   Activities of Daily Living In your present state of health, do you have any difficulty performing the following activities: 09/14/2020  Hearing? N  Vision? N  Difficulty concentrating or making decisions? Y  Comment memory at times  Walking or climbing stairs? N  Dressing or bathing? N  Doing errands, shopping? N  Preparing Food and eating ? N  Using the Toilet? N  In the past six months, have you accidently leaked urine? N  Do you have problems with loss of bowel control? N  Managing your Medications? N  Managing your Finances? N  Housekeeping or managing your Housekeeping? N  Some recent data might be hidden    Patient Care Team: [name], MD as PCP - General (Family Medicine) Luberta Mutter, MD as Consulting Physician  (Ophthalmology)  Indicate any recent Medical Services you may have received from other than [facility] providers in the past year (date may be approximate).     Assessment:   This is a routine wellness examination for Brian Welch.  Hearing/Vision screen  Hearing Screening   125Hz  250Hz  500Hz  1000Hz  2000Hz  3000Hz  4000Hz  6000Hz  8000Hz   Right ear:           Left ear:           Comments: Pt denies ay hearing issues   Vision Screening Comments: Pt follows up Dr Celene Squibb for annual eye exams   Dietary issues and exercise activities discussed: Current Exercise Habits: Home exercise routine, Type of exercise: walking, Time (  Minutes): 20, Frequency (Times/Week): 3, Weekly Exercise (Minutes/Week): 60  Goals    . Patient Stated     Stay healthy      Depression Screen PHQ 2/9 Scores 09/14/2020 07/17/2020 07/17/2019 07/04/2019 03/10/2017  PHQ - 2 Score 0 0 0 0 0    Fall Risk Fall Risk  09/14/2020 07/17/2020 07/04/2019 03/10/2017  Falls in the past year? 0 0 0 No  Number falls in past yr: 0 0 0 -  Injury with Fall? 0 0 0 -  Follow up Falls prevention discussed - Falls evaluation completed;Falls prevention discussed;Education provided -    FALL RISK PREVENTION PERTAINING TO THE HOME:  Any stairs in or around the home? No  If so, are there any without handrails? No  Home free of loose throw rugs in walkways, pet beds, electrical cords, etc? Yes  Adequate lighting in your home to reduce risk of falls? Yes   ASSISTIVE DEVICES UTILIZED TO PREVENT FALLS:  Life alert? No  Use of a cane, walker or w/c? No  Grab bars in the bathroom? No  Shower chair or bench in shower? Yes  Elevated toilet seat or a handicapped toilet? No   TIMED UP AND GO:  Was the test performed? Yes .  Length of time to ambulate 10 feet: 10 sec.   Gait steady and fast without use of assistive device  Cognitive Function:     6CIT Screen 09/14/2020 07/04/2019  What Year? 0 points 0 points  What month? 0 points 0 points  What  time? - 0 points  Count back from 20 0 points 0 points  Months in reverse 0 points 0 points  Repeat phrase 2 points -    Immunizations Immunization History  Administered Date(s) Administered  . Fluad Quad(high Dose 65+) 02/19/2019, 02/26/2020  . Influenza Split 03/13/2012  . Influenza Whole 04/06/2007, 03/11/2008, 03/23/2009, 02/17/2010  . Influenza, High Dose Seasonal PF 02/16/2017, 02/15/2018  . Influenza,inj,Quad PF,6+ Mos 02/19/2013, 02/10/2014, 03/16/2016  . Influenza-Unspecified 02/25/2015  . PFIZER(Purple Top)SARS-COV-2 Vaccination 07/04/2019, 07/29/2019, 03/11/2020, 09/03/2020  . Pneumococcal Conjugate-13 06/17/2014  . Pneumococcal Polysaccharide-23 02/17/2004  . Td 01/29/1999  . Tdap 10/12/2011  . Zoster 05/30/2008  . Zoster Recombinat (Shingrix) 11/20/2019, 05/01/2020    TDAP status: Up to date  Flu Vaccine status: Up to date  Pneumococcal vaccine status: Up to date  Covid-19 vaccine status: Completed vaccines  Qualifies for Shingles Vaccine? Yes   Zostavax completed Yes   Shingrix Completed?: Yes  Screening Tests Health Maintenance  Topic Date Due  . INFLUENZA VACCINE  12/28/2020  . TETANUS/TDAP  10/11/2021  . COVID-19 Vaccine  Completed  . PNA vac Low Risk Adult  Completed  . HPV VACCINES  Aged Out    Health Maintenance  There are no preventive care reminders to display for this patient.  Colorectal cancer screening: No longer required.     Additional Screening:   Vision Screening: Recommended annual ophthalmology exams for early detection of glaucoma and other disorders of the eye. Is the patient up to date with their annual eye exam?  Yes  Who is the provider or what is the name of the office in which the patient attends annual eye exams? Dr Celene Squibb If pt is not established with a provider, would they like to be referred to a provider to establish care? No .   Dental Screening: Recommended annual dental exams for proper oral  hygiene  Community Resource Referral / Chronic Care Management: CRR required this visit?  No   CCM required this visit?  No      Plan:     I have personally reviewed and noted the following in the patient's chart:   . Medical and social history . Use of alcohol, tobacco or illicit drugs  . Current medications and supplements . Functional ability and status . Nutritional status . Physical activity . Advanced directives . List of other physicians . Hospitalizations, surgeries, and ER visits in previous 12 months . Vitals . Screenings to include cognitive, depression, and falls . Referrals and appointments  In addition, I have reviewed and discussed with patient certain preventive protocols, quality metrics, and best practice recommendations. A written personalized care plan for preventive services as well as general preventive health recommendations were provided to patient.     Willette Brace, LPN   0/01/2329   Nurse Notes: None

## 2020-09-14 NOTE — Patient Instructions (Addendum)
Brian Welch , Thank you for taking time to come for your Medicare Wellness Visit. I appreciate your ongoing commitment to your health goals. Please review the following plan we discussed and let me know if I can assist you in the future.   Screening recommendations/referrals: Colonoscopy: No longer required  Recommended yearly ophthalmology/optometry visit for glaucoma screening and checkup Recommended yearly dental visit for hygiene and checkup  Vaccinations: Influenza vaccine: Up to date Pneumococcal vaccine: Up to date Tdap vaccine: Up to date Shingles vaccine: Completed 6/23 & 05/01/20   Covid-19: Completed 2/4, 3/1, 4/7, & 03/01/20  Advanced directives: Please bring a copy of your health care power of attorney and living will to the office at your convenience.  Conditions/risks identified: Stay Healthy   Next appointment: Follow up in one year for your annual wellness visit.   Preventive Care 43 Years and Older, Male Preventive care refers to lifestyle choices and visits with your health care provider that can promote health and wellness. What does preventive care include?  A yearly physical exam. This is also called an annual well check.  Dental exams once or twice a year.  Routine eye exams. Ask your health care provider how often you should have your eyes checked.  Personal lifestyle choices, including:  Daily care of your teeth and gums.  Regular physical activity.  Eating a healthy diet.  Avoiding tobacco and drug use.  Limiting alcohol use.  Practicing safe sex.  Taking low doses of aspirin every day.  Taking vitamin and mineral supplements as recommended by your health care provider. What happens during an annual well check? The services and screenings done by your health care provider during your annual well check will depend on your age, overall health, lifestyle risk factors, and family history of disease. Counseling  Your health care provider may ask you  questions about your:  Alcohol use.  Tobacco use.  Drug use.  Emotional well-being.  Home and relationship well-being.  Sexual activity.  Eating habits.  History of falls.  Memory and ability to understand (cognition).  Work and work Statistician. Screening  You may have the following tests or measurements:  Height, weight, and BMI.  Blood pressure.  Lipid and cholesterol levels. These may be checked every 5 years, or more frequently if you are over 25 years old.  Skin check.  Lung cancer screening. You may have this screening every year starting at age 66 if you have a 30-pack-year history of smoking and currently smoke or have quit within the past 15 years.  Fecal occult blood test (FOBT) of the stool. You may have this test every year starting at age 6.  Flexible sigmoidoscopy or colonoscopy. You may have a sigmoidoscopy every 5 years or a colonoscopy every 10 years starting at age 26.  Prostate cancer screening. Recommendations will vary depending on your family history and other risks.  Hepatitis C blood test.  Hepatitis B blood test.  Sexually transmitted disease (STD) testing.  Diabetes screening. This is done by checking your blood sugar (glucose) after you have not eaten for a while (fasting). You may have this done every 1-3 years.  Abdominal aortic aneurysm (AAA) screening. You may need this if you are a current or former smoker.  Osteoporosis. You may be screened starting at age 82 if you are at high risk. Talk with your health care provider about your test results, treatment options, and if necessary, the need for more tests. Vaccines  Your health care provider  may recommend certain vaccines, such as:  Influenza vaccine. This is recommended every year.  Tetanus, diphtheria, and acellular pertussis (Tdap, Td) vaccine. You may need a Td booster every 10 years.  Zoster vaccine. You may need this after age 79.  Pneumococcal 13-valent conjugate  (PCV13) vaccine. One dose is recommended after age 24.  Pneumococcal polysaccharide (PPSV23) vaccine. One dose is recommended after age 10. Talk to your health care provider about which screenings and vaccines you need and how often you need them. This information is not intended to replace advice given to you by your health care provider. Make sure you discuss any questions you have with your health care provider. Document Released: 06/12/2015 Document Revised: 02/03/2016 Document Reviewed: 03/17/2015 Elsevier Interactive Patient Education  2017 [employer] Prevention in the Home Falls can cause injuries. They can happen to people of all ages. There are many things you can do to make your home safe and to help prevent falls. What can I do on the outside of my home?  Regularly fix the edges of walkways and driveways and fix any cracks.  Remove anything that might make you trip as you walk through a door, such as a raised step or threshold.  Trim any bushes or trees on the path to your home.  Use bright outdoor lighting.  Clear any walking paths of anything that might make someone trip, such as rocks or tools.  Regularly check to see if handrails are loose or broken. Make sure that both sides of any steps have handrails.  Any raised decks and porches should have guardrails on the edges.  Have any leaves, snow, or ice cleared regularly.  Use sand or salt on walking paths during winter.  Clean up any spills in your garage right away. This includes oil or grease spills. What can I do in the bathroom?  Use night lights.  Install grab bars by the toilet and in the tub and shower. Do not use towel bars as grab bars.  Use non-skid mats or decals in the tub or shower.  If you need to sit down in the shower, use a plastic, non-slip stool.  Keep the floor dry. Clean up any water that spills on the floor as soon as it happens.  Remove soap buildup in the tub or shower  regularly.  Attach bath mats securely with double-sided non-slip rug tape.  Do not have throw rugs and other things on the floor that can make you trip. What can I do in the bedroom?  Use night lights.  Make sure that you have a light by your bed that is easy to reach.  Do not use any sheets or blankets that are too big for your bed. They should not hang down onto the floor.  Have a firm chair that has side arms. You can use this for support while you get dressed.  Do not have throw rugs and other things on the floor that can make you trip. What can I do in the kitchen?  Clean up any spills right away.  Avoid walking on wet floors.  Keep items that you use a lot in easy-to-reach places.  If you need to reach something above you, use a strong step stool that has a grab bar.  Keep electrical cords out of the way.  Do not use floor polish or wax that makes floors slippery. If you must use wax, use non-skid floor wax.  Do not have throw rugs  and other things on the floor that can make you trip. What can I do with my stairs?  Do not leave any items on the stairs.  Make sure that there are handrails on both sides of the stairs and use them. Fix handrails that are broken or loose. Make sure that handrails are as long as the stairways.  Check any carpeting to make sure that it is firmly attached to the stairs. Fix any carpet that is loose or worn.  Avoid having throw rugs at the top or bottom of the stairs. If you do have throw rugs, attach them to the floor with carpet tape.  Make sure that you have a light switch at the top of the stairs and the bottom of the stairs. If you do not have them, ask someone to add them for you. What else can I do to help prevent falls?  Wear shoes that:  Do not have high heels.  Have rubber bottoms.  Are comfortable and fit you well.  Are closed at the toe. Do not wear sandals.  If you use a stepladder:  Make sure that it is fully  opened. Do not climb a closed stepladder.  Make sure that both sides of the stepladder are locked into place.  Ask someone to hold it for you, if possible.  Clearly mark and make sure that you can see:  Any grab bars or handrails.  First and last steps.  Where the edge of each step is.  Use tools that help you move around (mobility aids) if they are needed. These include:  Canes.  Walkers.  Scooters.  Crutches.  Turn on the lights when you go into a dark area. Replace any light bulbs as soon as they burn out.  Set up your furniture so you have a clear path. Avoid moving your furniture around.  If any of your floors are uneven, fix them.  If there are any pets around you, be aware of where they are.  Review your medicines with your doctor. Some medicines can make you feel dizzy. This can increase your chance of falling. Ask your doctor what other things that you can do to help prevent falls. This information is not intended to replace advice given to you by your health care provider. Make sure you discuss any questions you have with your health care provider. Document Released: 03/12/2009 Document Revised: 10/22/2015 Document Reviewed: 06/20/2014 Elsevier Interactive Patient Education  2017 [employer].

## 2021-02-09 ENCOUNTER — Ambulatory Visit (INDEPENDENT_AMBULATORY_CARE_PROVIDER_SITE_OTHER): Payer: Medicare HMO

## 2021-02-09 ENCOUNTER — Other Ambulatory Visit: Payer: Self-pay

## 2021-02-09 DIAGNOSIS — Z23 Encounter for immunization: Secondary | ICD-10-CM | POA: Diagnosis not present

## 2021-02-12 IMAGING — CT CT HEAD W/O CM
4 series · 16 of 47 positions shown, 18 images · non-contrast
Comparison: None.

CLINICAL DATA: Posttraumatic headache.  Emesis.

EXAM:
CT HEAD WITHOUT CONTRAST
TECHNIQUE: Contiguous axial images were obtained from the base of the skull
through the vertex without intravenous contrast.

[Series 3: head without · axial · non-contrast · 0.44mm/px · z∈[-42,+83]mm · 7 of 35 slices shown, 9 images]
[im 5/35  brain]
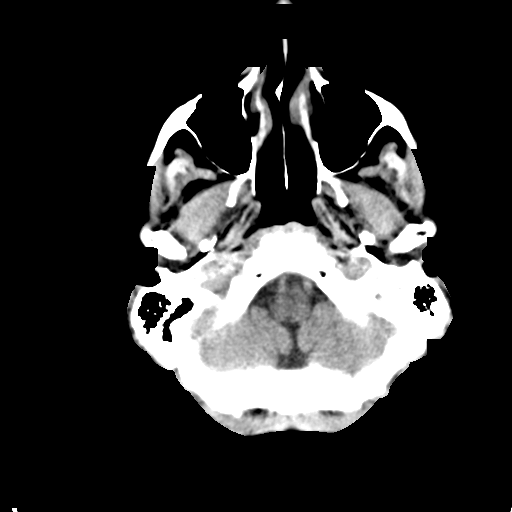
[im 5/35  bone]
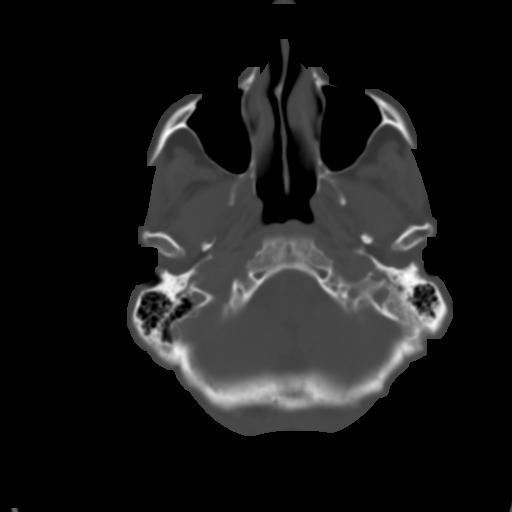
[im 9/35  brain]
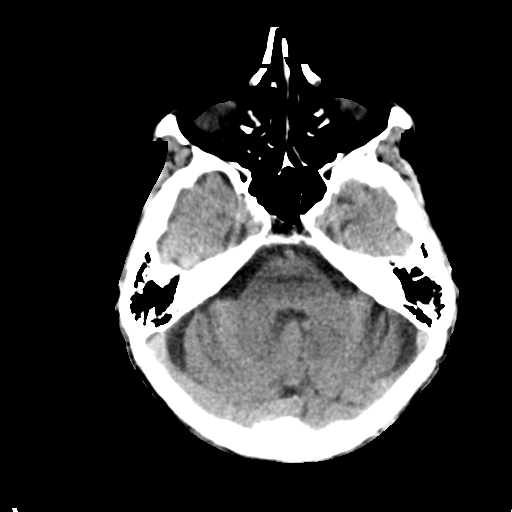
[im 13/35  brain]
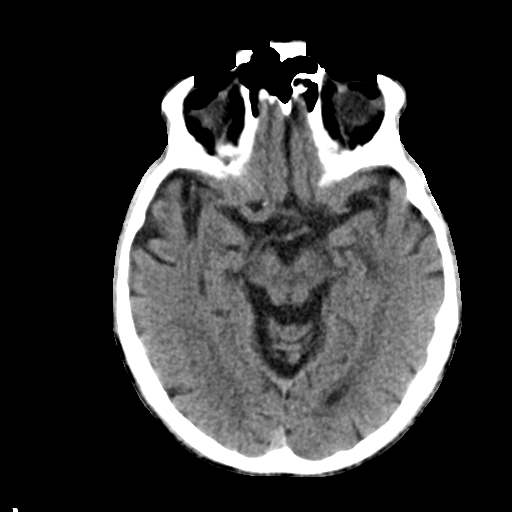
[im 18/35  brain]
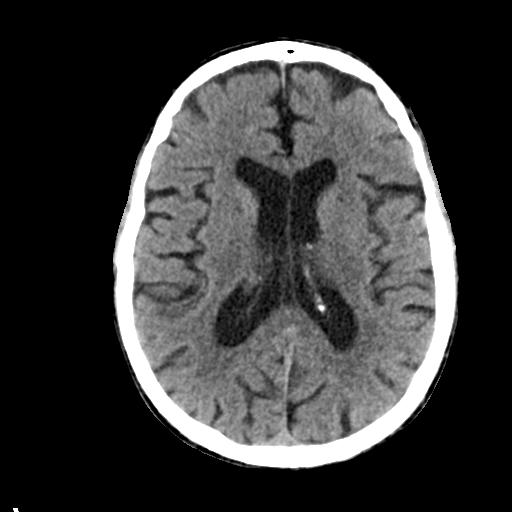
[im 22/35  brain]
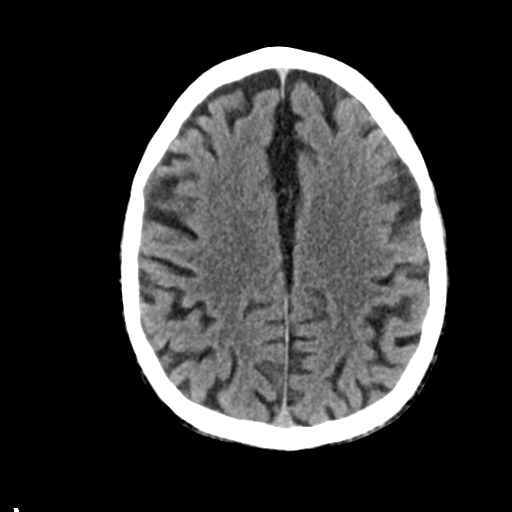
[im 22/35  bone]
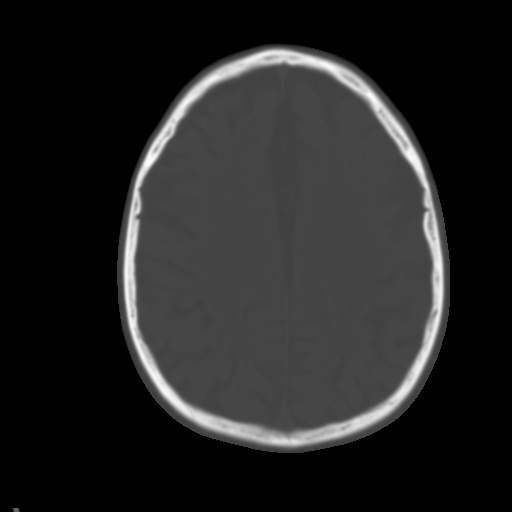
[im 26/35  brain]
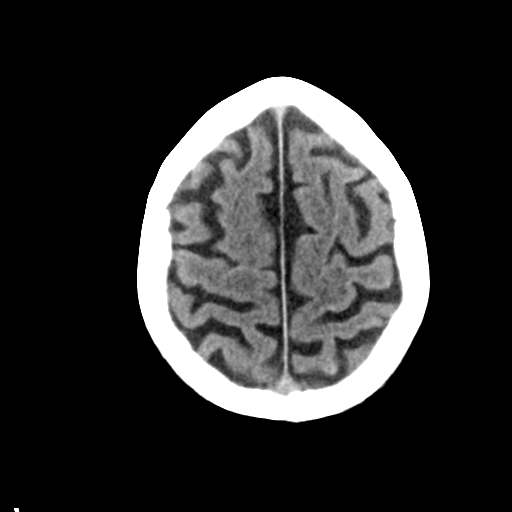
[im 30/35  brain]
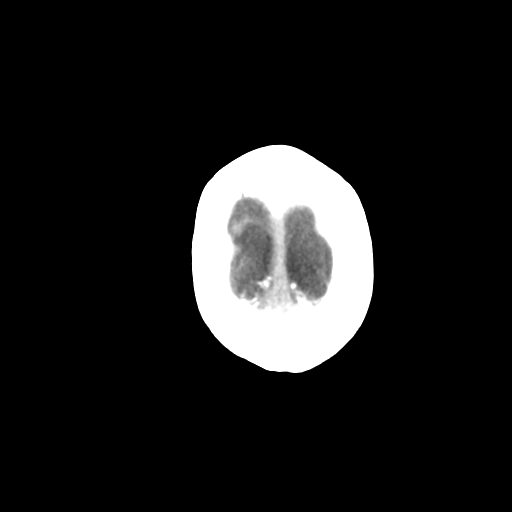

[Series 4: head bone · axial · 0.44mm/px · z∈[-46,-12]mm · 3 of 87 slices shown]
[im 9/87  bone]
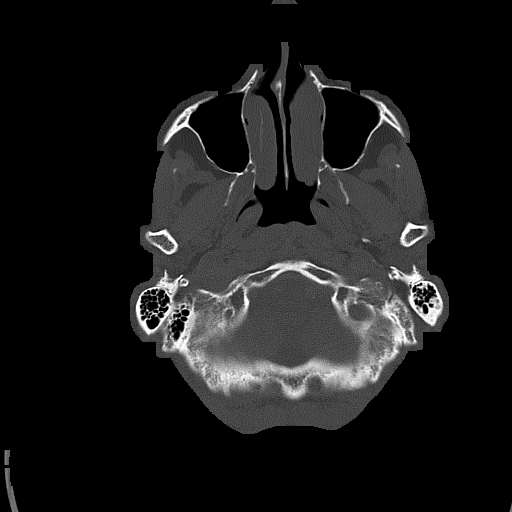
[im 18/87  bone]
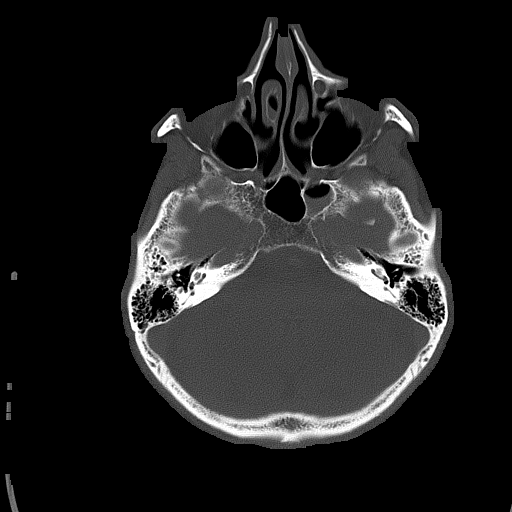
[im 26/87  bone]
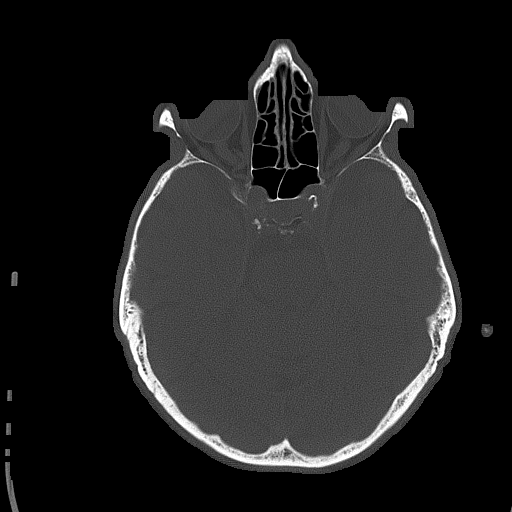

[Series 5: head without cor · coronal · non-contrast · 0.34mm/px · 3 of 73 slices shown]
[im 25/73  brain]
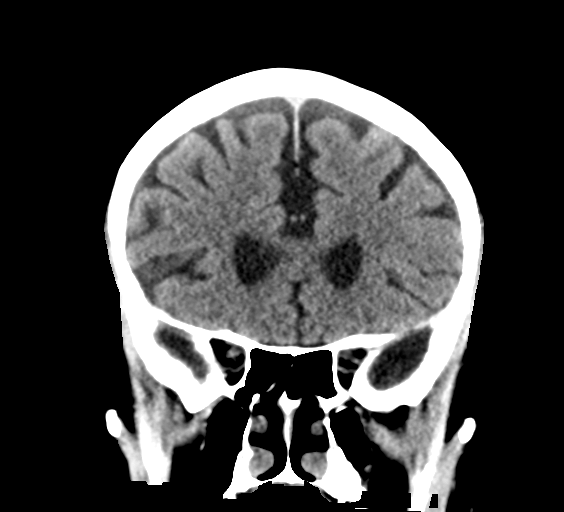
[im 33/73  brain]
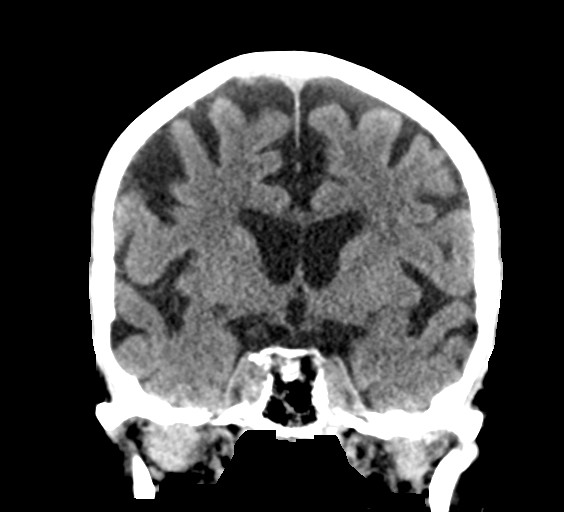
[im 41/73  brain]
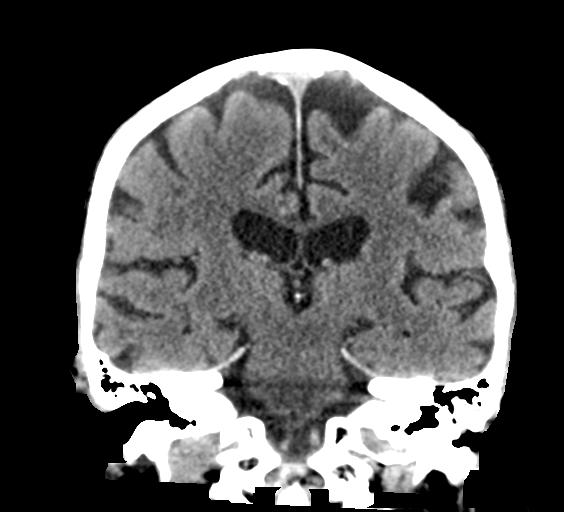

[Series 6: head without sag · sagittal · non-contrast · 0.34mm/px · 3 of 64 slices shown]
[im 22/64  brain]
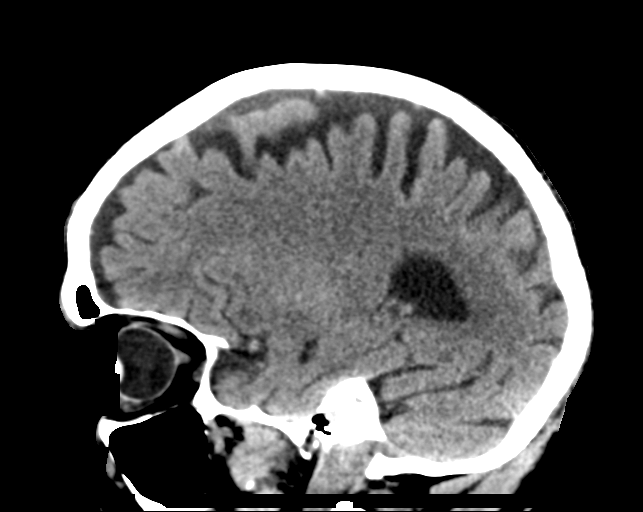
[im 32/64  brain]
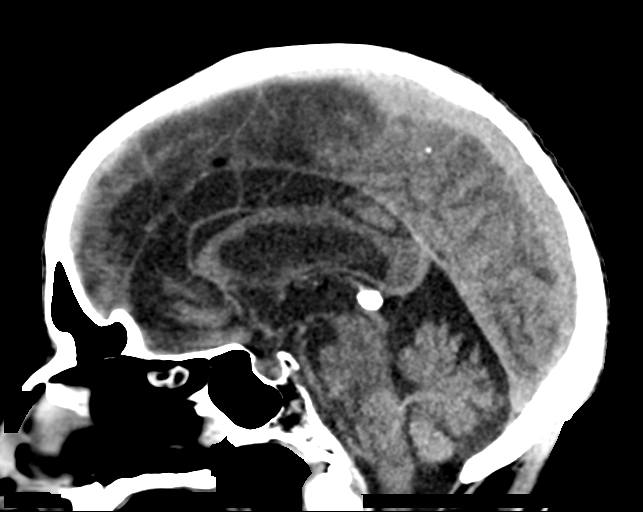
[im 43/64  brain]
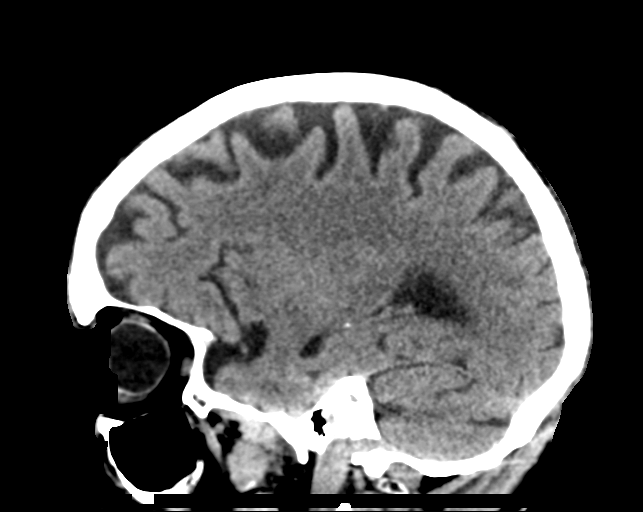

[16 of 47 positions shown; findings below may reference images not displayed]

FINDINGS: Brain: There is no evidence of acute infarct, intracranial
hemorrhage, mass, midline shift, or extra-axial fluid collection.
Cerebral atrophy is within normal limits for age. Scattered
hypodensities in the cerebral white matter bilaterally are
nonspecific but compatible with mild chronic small vessel ischemic
disease.

Vascular: Calcified atherosclerosis at the skull base. No hyperdense
vessel.

Skull: No fracture or suspicious osseous lesion.

Sinuses/Orbits: Small volume fluid in the left sphenoid sinus. Clear
mastoid air cells. Left cataract extraction.

Other: None.
IMPRESSION: 1. No evidence of acute intracranial abnormality.
2. Mild chronic small vessel ischemic disease.

## 2021-03-03 DIAGNOSIS — H35372 Puckering of macula, left eye: Secondary | ICD-10-CM | POA: Diagnosis not present

## 2021-03-03 DIAGNOSIS — H2511 Age-related nuclear cataract, right eye: Secondary | ICD-10-CM | POA: Diagnosis not present

## 2021-03-03 DIAGNOSIS — H5212 Myopia, left eye: Secondary | ICD-10-CM | POA: Diagnosis not present

## 2021-04-05 ENCOUNTER — Telehealth: Payer: Self-pay

## 2021-04-05 NOTE — Telephone Encounter (Signed)
LAST APPOINTMENT DATE: 07/17/20  NEXT APPOINTMENT DATE: None   MEDICATION:pantoprazole (PROTONIX) 40 MG tablet (Expired)  PHARMACY: [facility] [address], [place] [address]    **Prescription needs PA**

## 2021-04-05 NOTE — Telephone Encounter (Signed)
PA has been started and submitted.

## 2021-04-06 ENCOUNTER — Other Ambulatory Visit: Payer: Self-pay | Admitting: Family Medicine

## 2021-04-08 ENCOUNTER — Telehealth (INDEPENDENT_AMBULATORY_CARE_PROVIDER_SITE_OTHER): Payer: Medicare HMO | Admitting: Family Medicine

## 2021-04-08 ENCOUNTER — Encounter: Payer: Self-pay | Admitting: Family Medicine

## 2021-04-08 VITALS — BP 118/62 | Temp 99.0°F | Ht 71.0 in | Wt 159.0 lb

## 2021-04-08 DIAGNOSIS — J329 Chronic sinusitis, unspecified: Secondary | ICD-10-CM

## 2021-04-08 MED ORDER — DOXYCYCLINE HYCLATE 100 MG PO TABS: 100.0000 mg | ORAL_TABLET | Freq: Two times a day (BID) | ORAL | 0 refills | Status: DC

## 2021-04-08 NOTE — Progress Notes (Signed)
   Brian Welch is a 87 y.o. male who presents today for a telephone visit.  Assessment/Plan:  Cough / Sinusitis Given length of symptoms we will start doxycycline.  Discussed limitations of virtual visit and inability to perform physical exam.  No red flags today.  If they are not improving by next week they will come in for in person office visit.     Subjective:  HPI:  Patient with cough and congestion for 3 weeks.  Wife has been sick with similar symptoms.  She got a prescription for doxycycline which seemed to help the symptoms.  Symptoms have been worsening.  Cough sometimes wakes him up at night.  No fevers or chills.  No chest pain.       Objective/Observations   NAD  Telephone Visit   I connected with Brian Welch on 04/08/21 at 11:20 AM EST via telephone and verified that I am speaking with the correct person using two identifiers. I discussed the limitations of evaluation and management by telemedicine and the availability of in person appointments. The patient expressed understanding and agreed to proceed.   Patient location: Home Provider location: [place] participating in the virtual visit: Myself and Patient and his wife  A total of 11 minutes were spent on medical discussion.      Brian Welch. Jerline Pain, MD 04/08/2021 12:01 PM

## 2021-07-01 ENCOUNTER — Telehealth: Payer: Self-pay | Admitting: Family Medicine

## 2021-07-01 MED ORDER — PANTOPRAZOLE SODIUM 40 MG PO TBEC: 40.0000 mg | DELAYED_RELEASE_TABLET | Freq: Every day | ORAL | 0 refills | Status: DC

## 2021-07-01 NOTE — Telephone Encounter (Signed)
Rx refilled.

## 2021-07-01 NOTE — Telephone Encounter (Signed)
Patient called asking for a refill on pantoprazole (PROTONIX) 40 MG tablet. [place] [address] confirmed.- lucia

## 2021-09-17 ENCOUNTER — Ambulatory Visit (INDEPENDENT_AMBULATORY_CARE_PROVIDER_SITE_OTHER): Payer: Medicare HMO

## 2021-09-17 DIAGNOSIS — Z0000 Encounter for general adult medical examination without abnormal findings: Secondary | ICD-10-CM | POA: Diagnosis not present

## 2021-09-17 NOTE — Patient Instructions (Signed)
Mr. Jeschke , ?Thank you for taking time to come for your Medicare Wellness Visit. I appreciate your ongoing commitment to your health goals. Please review the following plan we discussed and let me know if I can assist you in the future.  ? ?Screening recommendations/referrals: ?Colonoscopy: No longer required  ?Recommended yearly ophthalmology/optometry visit for glaucoma screening and checkup ?Recommended yearly dental visit for hygiene and checkup ? ?Vaccinations: ?Influenza vaccine: Done 02/09/21 repeat every year  ?Pneumococcal vaccine: Up to date ?Tdap vaccine: Done 10/12/11 repeat every 10 years due 10/09/21 ?Shingles vaccine: Completed 6/23 and 05/01/20   ?Covid-19: Completed 2/4, 07/29/19,03/11/20 & 09/03/20 ? ?Advanced directives: Please bring a copy of your health care power of attorney and living will to the office at your convenience. ? ?Conditions/risks identified: keep exercising  ? ?Next appointment: Follow up in one year for your annual wellness visit.  ? ?Preventive Care 24 Years and Older, Male ?Preventive care refers to lifestyle choices and visits with your health care provider that can promote health and wellness. ?What does preventive care include? ?A yearly physical exam. This is also called an annual well check. ?Dental exams once or twice a year. ?Routine eye exams. Ask your health care provider how often you should have your eyes checked. ?Personal lifestyle choices, including: ?Daily care of your teeth and gums. ?Regular physical activity. ?Eating a healthy diet. ?Avoiding tobacco and drug use. ?Limiting alcohol use. ?Practicing safe sex. ?Taking low doses of aspirin every day. ?Taking vitamin and mineral supplements as recommended by your health care provider. ?What happens during an annual well check? ?The services and screenings done by your health care provider during your annual well check will depend on your age, overall health, lifestyle risk factors, and family history of  disease. ?Counseling  ?Your health care provider may ask you questions about your: ?Alcohol use. ?Tobacco use. ?Drug use. ?Emotional well-being. ?Home and relationship well-being. ?Sexual activity. ?Eating habits. ?History of falls. ?Memory and ability to understand (cognition). ?Work and work Statistician. ?Screening  ?You may have the following tests or measurements: ?Height, weight, and BMI. ?Blood pressure. ?Lipid and cholesterol levels. These may be checked every 5 years, or more frequently if you are over 3 years old. ?Skin check. ?Lung cancer screening. You may have this screening every year starting at age 29 if you have a 30-pack-year history of smoking and currently smoke or have quit within the past 15 years. ?Fecal occult blood test (FOBT) of the stool. You may have this test every year starting at age 11. ?Flexible sigmoidoscopy or colonoscopy. You may have a sigmoidoscopy every 5 years or a colonoscopy every 10 years starting at age 104. ?Prostate cancer screening. Recommendations will vary depending on your family history and other risks. ?Hepatitis C blood test. ?Hepatitis B blood test. ?Sexually transmitted disease (STD) testing. ?Diabetes screening. This is done by checking your blood sugar (glucose) after you have not eaten for a while (fasting). You may have this done every 1-3 years. ?Abdominal aortic aneurysm (AAA) screening. You may need this if you are a current or former smoker. ?Osteoporosis. You may be screened starting at age 25 if you are at high risk. ?Talk with your health care provider about your test results, treatment options, and if necessary, the need for more tests. ?Vaccines  ?Your health care provider may recommend certain vaccines, such as: ?Influenza vaccine. This is recommended every year. ?Tetanus, diphtheria, and acellular pertussis (Tdap, Td) vaccine. You may need a Td booster every 10  years. ?Zoster vaccine. You may need this after age 17. ?Pneumococcal 13-valent  conjugate (PCV13) vaccine. One dose is recommended after age 70. ?Pneumococcal polysaccharide (PPSV23) vaccine. One dose is recommended after age 77. ?Talk to your health care provider about which screenings and vaccines you need and how often you need them. ?This information is not intended to replace advice given to you by your health care provider. Make sure you discuss any questions you have with your health care provider. ?Document Released: 06/12/2015 Document Revised: 02/03/2016 Document Reviewed: 03/17/2015 ?Elsevier Interactive Patient Education ? 2017 [place]. ? ?Fall Prevention in the Home ?Falls can cause injuries. They can happen to people of all ages. There are many things you can do to make your home safe and to help prevent falls. ?What can I do on the outside of my home? ?Regularly fix the edges of walkways and driveways and fix any cracks. ?Remove anything that might make you trip as you walk through a door, such as a raised step or threshold. ?Trim any bushes or trees on the path to your home. ?Use bright outdoor lighting. ?Clear any walking paths of anything that might make someone trip, such as rocks or tools. ?Regularly check to see if handrails are loose or broken. Make sure that both sides of any steps have handrails. ?Any raised decks and porches should have guardrails on the edges. ?Have any leaves, snow, or ice cleared regularly. ?Use sand or salt on walking paths during winter. ?Clean up any spills in your garage right away. This includes oil or grease spills. ?What can I do in the bathroom? ?Use night lights. ?Install grab bars by the toilet and in the tub and shower. Do not use towel bars as grab bars. ?Use non-skid mats or decals in the tub or shower. ?If you need to sit down in the shower, use a plastic, non-slip stool. ?Keep the floor dry. Clean up any water that spills on the floor as soon as it happens. ?Remove soap buildup in the tub or shower regularly. ?Attach bath mats  securely with double-sided non-slip rug tape. ?Do not have throw rugs and other things on the floor that can make you trip. ?What can I do in the bedroom? ?Use night lights. ?Make sure that you have a light by your bed that is easy to reach. ?Do not use any sheets or blankets that are too big for your bed. They should not hang down onto the floor. ?Have a firm chair that has side arms. You can use this for support while you get dressed. ?Do not have throw rugs and other things on the floor that can make you trip. ?What can I do in the kitchen? ?Clean up any spills right away. ?Avoid walking on wet floors. ?Keep items that you use a lot in easy-to-reach places. ?If you need to reach something above you, use a strong step stool that has a grab bar. ?Keep electrical cords out of the way. ?Do not use floor polish or wax that makes floors slippery. If you must use wax, use non-skid floor wax. ?Do not have throw rugs and other things on the floor that can make you trip. ?What can I do with my stairs? ?Do not leave any items on the stairs. ?Make sure that there are handrails on both sides of the stairs and use them. Fix handrails that are broken or loose. Make sure that handrails are as long as the stairways. ?Check any carpeting to make sure  that it is firmly attached to the stairs. Fix any carpet that is loose or worn. ?Avoid having throw rugs at the top or bottom of the stairs. If you do have throw rugs, attach them to the floor with carpet tape. ?Make sure that you have a light switch at the top of the stairs and the bottom of the stairs. If you do not have them, ask someone to add them for you. ?What else can I do to help prevent falls? ?Wear shoes that: ?Do not have high heels. ?Have rubber bottoms. ?Are comfortable and fit you well. ?Are closed at the toe. Do not wear sandals. ?If you use a stepladder: ?Make sure that it is fully opened. Do not climb a closed stepladder. ?Make sure that both sides of the stepladder  are locked into place. ?Ask someone to hold it for you, if possible. ?Clearly mark and make sure that you can see: ?Any grab bars or handrails. ?First and last steps. ?Where the edge of each step is. ?Use tools that help you move

## 2021-09-17 NOTE — Progress Notes (Addendum)
Virtual Visit via Telephone Note ? ?I connected with  Brian Welch on 09/17/21 at  2:00 PM EDT by telephone and verified that I am speaking with the correct person using two identifiers. ? ?Medicare Annual Wellness visit completed telephonically due to Covid-19 pandemic.  ? ?Persons participating in this call: This Health Coach and this patient.  ? ?Location: ?Patient: Home ?Provider: Office  ?  ?I discussed the limitations, risks, security and privacy concerns of performing an evaluation and management service by telephone and the availability of in person appointments. The patient expressed understanding and agreed to proceed. ? ?Unable to perform video visit due to video visit attempted and failed and/or patient does not have video capability.  ? ?Some vital signs may be absent or patient reported.  ? ?Willette Brace, LPN ? ? ?Subjective:  ? Brian Welch is a 88 y.o. male who presents for Medicare Annual/Subsequent preventive examination. ? ?Review of Systems    ? ?Cardiac Risk Factors include: advanced age (>74mn, >>61women);dyslipidemia;male gender ? ?   ?Objective:  ?  ?There were no vitals filed for this visit. ?There is no height or weight on file to calculate BMI. ? ? ?  09/17/2021  ?  2:00 PM 09/14/2020  ?  1:58 PM 07/04/2019  ?  2:12 PM  ?Advanced Directives  ?Does Patient Have a Medical Advance Directive? Yes Yes Yes  ?[place] Healthcare Power of Attorney Living will;Healthcare Power of Attorney  ?Does patient want to make changes to medical advance directive?   No - Patient declined  ?Copy of [place]? No - copy requested No - copy requested No - copy requested  ? ? ?Current Medications (verified) ?Outpatient Encounter Medications as of 09/17/2021  ?Medication Sig  ? Cholecalciferol (VITAMIN D-3 PO) Take 1 tablet by mouth daily.   ? co-enzyme Q-10 30 MG capsule Take 30 mg by mouth daily.   ? doxycycline  (VIBRA-TABS) 100 MG tablet Take 1 tablet (100 mg total) by mouth 2 (two) times daily.  ? Garlic 10 MG CAPS Take 1 capsule by mouth daily.   ? Multiple Vitamins-Minerals (EYE VITAMINS PO) Take 1 tablet by mouth daily.   ? Omega-3 Fatty Acids (FISH OIL) 1000 MG CPDR Take 1 capsule by mouth daily.   ? pantoprazole (PROTONIX) 40 MG tablet Take 1 tablet (40 mg total) by mouth daily.  ? RESVERATROL 100 MG CAPS Take 1 capsule by mouth daily.   ? PREVNAR 20 0.5 ML injection   ? SHINGRIX injection   ? ?No facility-administered encounter medications on file as of 09/17/2021.  ? ? ?Allergies (verified) ?Patient has no known allergies.  ? ?History: ?Past Medical History:  ?Diagnosis Date  ? History of basal cell carcinoma 06/16/2014  ? 2013 Left ear   ? History of colonic polyps 06/16/2014  ? Adenoma 2010. Advised 5 year follow up.    ? ?Past Surgical History:  ?Procedure Laterality Date  ? BIOPSY  12/17/2019  ? Procedure: BIOPSY;  Surgeon: PIrene Shipper MD;  [facility];  Service: Endoscopy;;  ? ESOPHAGOGASTRODUODENOSCOPY (EGD) WITH PROPOFOL N/A 12/17/2019  ? Procedure: ESOPHAGOGASTRODUODENOSCOPY (EGD) WITH PROPOFOL;  Surgeon: PIrene Shipper MD;  [facility];  Service: Endoscopy;  Laterality: N/A;  ? HEMOSTASIS CLIP PLACEMENT  12/17/2019  ? Procedure: HEMOSTASIS CLIP PLACEMENT;  Surgeon: PIrene Shipper MD;  [facility];  Service: Endoscopy;;  ? HEMOSTASIS CONTROL  12/17/2019  ? Procedure: HEMOSTASIS CONTROL;  Surgeon: Irene Shipper, MD;  [facility];  Service: Endoscopy;;  ? left eye cataract removal    ? ?Family History  ?Problem Relation Age of Onset  ? Stroke Mother   ?     1, nonsmoker. lived to 25  ? Tuberculosis Father   ?     died 91 months before patient born  ? ?Social History  ? ?Socioeconomic History  ? Marital status: Married  ?  Spouse name: Not on file  ? Number of children: 1  ? Years of education: Not on file  ? Highest education level: Not on file  ?Occupational History  ?  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 Smoking status: Former  ?  Packs/day: 1.00  ?  Years: 15.00  ?  Pack years: 15.00  ?  Types: Cigarettes  ?  Quit date: 09/07/1973  ?  Years since quitting: 48.0  ? Smokeless tobacco: Never  ?Vaping Use  ? Vaping Use: Never used  ?Substance and Sexual Activity  ? Alcohol use: No  ?  Alcohol/week: 0.0 standard drinks  ? Drug use: No  ? Sexual activity: Not on file  ?Other Topics Concern  ? Not on file  ?Social History Narrative  ? Married (wife Arbie Cookey patient of Dr. Yong Channel). 1 son. No grandchildren.   ?   ? Retired from Conservation officer, historic buildings  ?   ? Hobbies: exercise, see movies, plays and musicals  ? ?Social Determinants of Health  ? ?Financial Resource Strain: Low Risk   ? Difficulty of Paying Living Expenses: Not hard at all  ?Food Insecurity: No Food Insecurity  ? Worried About Charity fundraiser in the Last Year: Never true  ? Ran Out of Food in the Last Year: Never true  ?Transportation Needs: No Transportation Needs  ? Lack of Transportation (Medical): No  ? Lack of Transportation (Non-Medical): No  ?Physical Activity: Insufficiently Active  ? Days of Exercise per Week: 3 days  ? Minutes of Exercise per Session: 20 min  ?Stress: No Stress Concern Present  ? Feeling of Stress : Not at all  ?Social Connections: Moderately Isolated  ? Frequency of Communication with Friends and Family: Twice a week  ? Frequency of Social Gatherings with Friends and Family: Never  ? Attends Religious Services: More than 4 times per year  ? Active Member of Clubs or Organizations: No  ? Attends Archivist Meetings: Never  ? Marital Status: Married  ? ? ?Tobacco Counseling ?Counseling given: Not Answered ? ? ?Clinical Intake: ? ?Pre-visit preparation completed: Yes ? ?Pain : No/denies pain ? ?  ? ?BMI - recorded: 22.19 ?Nutritional Status: BMI of 19-24  Normal ?Nutritional Risks: None ?Diabetes: No ? ?How often do you need to have someone help you when you  read instructions, pamphlets, or other written materials from your doctor or pharmacy?: 1 - Never ? ?Diabetic?no ? ?Interpreter Needed?: No ? ?Information entered by :: Charlott Rakes, LPN ? ? ?Activities of Daily Living ? ?  09/17/2021  ?  2:02 PM  ?In your present state of health, do you have any difficulty performing the following activities:  ?Hearing? 0  ?Vision? 0  ?Difficulty concentrating or making decisions? 0  ?Walking or climbing stairs? 0  ?Dressing or bathing? 0  ?Doing errands, shopping? 0  ?Preparing Food and eating ? N  ?Using the Toilet? N  ?In the past six  months, have you accidently leaked urine? N  ?Do you have problems with loss of bowel control? N  ?Managing your Medications? N  ?Managing your Finances? N  ?Housekeeping or managing your Housekeeping? N  ? ? ?Patient Care Team: ?[name], MD as PCP - General (Family Medicine) ?Luberta Mutter, MD as Consulting Physician (Ophthalmology) ? ?Indicate any recent Medical Services you may have received from other than [facility] providers in the past year (date may be approximate). ? ?   ?Assessment:  ? This is a routine wellness examination for Brian Welch. ? ?Hearing/Vision screen ?Hearing Screening - Comments:: Pt denies any hearing ?Vision Screening - Comments:: Pt follows up with Dr Ellie Lunch for annual eye exams  ? ?Dietary issues and exercise activities discussed: ?Current Exercise Habits: Home exercise routine, Type of exercise: Other - see comments;strength training/weights;stretching, Time (Minutes): 30, Frequency (Times/Week): 3, Weekly Exercise (Minutes/Week): 90 ? ? Goals Addressed   ? ?  ?  ?  ?  ? This Visit's Progress  ?  Patient Stated     ?  Keep exercising  ?  ? ?  ? ?Depression Screen ? ?  09/17/2021  ?  1:59 PM 09/14/2020  ?  1:54 PM 07/17/2020  ? 10:33 AM 07/17/2019  ?  9:07 AM 07/04/2019  ?  2:13 PM 03/10/2017  ? 10:56 AM  ?PHQ 2/9 Scores  ?PHQ - 2 Score 0 0 0 0 0 0  ?  ?Fall Risk ? ?  09/17/2021  ?  2:02 PM 09/14/2020  ?  1:59 PM  07/17/2020  ? 10:33 AM 07/04/2019  ?  2:13 PM 03/10/2017  ? 10:56 AM  ?Fall Risk   ?Falls in the past year? 0 0 0 0 No  ?Number falls in past yr: 0 0 0 0   ?Injury with Fall? 0 0 0 0   ?Follow up  Falls prevention discussed

## 2021-09-20 ENCOUNTER — Ambulatory Visit: Payer: Medicare HMO

## 2021-09-27 ENCOUNTER — Other Ambulatory Visit: Payer: Self-pay | Admitting: Family Medicine

## 2021-09-27 MED ORDER — PANTOPRAZOLE SODIUM 40 MG PO TBEC: 40.0000 mg | DELAYED_RELEASE_TABLET | Freq: Every day | ORAL | 3 refills | Status: DC

## 2021-12-31 ENCOUNTER — Ambulatory Visit (INDEPENDENT_AMBULATORY_CARE_PROVIDER_SITE_OTHER): Payer: Medicare HMO | Admitting: Family Medicine

## 2021-12-31 ENCOUNTER — Encounter: Payer: Self-pay | Admitting: Family Medicine

## 2021-12-31 VITALS — BP 130/68 | HR 56 | Temp 98.3°F | Ht 71.0 in | Wt 152.6 lb

## 2021-12-31 DIAGNOSIS — Z0000 Encounter for general adult medical examination without abnormal findings: Secondary | ICD-10-CM

## 2021-12-31 DIAGNOSIS — E785 Hyperlipidemia, unspecified: Secondary | ICD-10-CM | POA: Diagnosis not present

## 2021-12-31 DIAGNOSIS — Z79899 Other long term (current) drug therapy: Secondary | ICD-10-CM | POA: Diagnosis not present

## 2021-12-31 DIAGNOSIS — R490 Dysphonia: Secondary | ICD-10-CM | POA: Diagnosis not present

## 2021-12-31 LAB — COMPREHENSIVE METABOLIC PANEL
ALT: 18 U/L (ref 0–53)
AST: 20 U/L (ref 0–37)
Albumin: 4.6 g/dL (ref 3.5–5.2)
Alkaline Phosphatase: 56 U/L (ref 39–117)
BUN: 15 mg/dL (ref 6–23)
CO2: 29 mEq/L (ref 19–32)
Calcium: 10 mg/dL (ref 8.4–10.5)
Chloride: 103 mEq/L (ref 96–112)
Creatinine, Ser: 0.83 mg/dL (ref 0.40–1.50)
GFR: 78.41 mL/min (ref >60.00)
Glucose, Bld: 92 mg/dL (ref 70–99)
Potassium: 5.3 mEq/L — ABNORMAL HIGH (ref 3.5–5.1)
Sodium: 142 mEq/L (ref 135–145)
Total Bilirubin: 2.7 mg/dL — ABNORMAL HIGH (ref 0.2–1.2)
Total Protein: 7.1 g/dL (ref 6.0–8.3)

## 2021-12-31 LAB — LIPID PANEL
Cholesterol: 173 mg/dL (ref 0–200)
HDL: 41.4 mg/dL (ref >39.00)
NonHDL: 131.82
Total CHOL/HDL Ratio: 4
Triglycerides: 328 mg/dL — ABNORMAL HIGH (ref 0.0–149.0)
VLDL: 65.6 mg/dL — ABNORMAL HIGH (ref 0.0–40.0)

## 2021-12-31 LAB — CBC WITH DIFFERENTIAL/PLATELET
Basophils Absolute: 0.1 10*3/uL (ref 0.0–0.1)
Basophils Relative: 0.9 % (ref 0.0–3.0)
Eosinophils Absolute: 0.2 10*3/uL (ref 0.0–0.7)
Eosinophils Relative: 2.7 % (ref 0.0–5.0)
HCT: 44.9 % (ref 39.0–52.0)
Hemoglobin: 15.3 g/dL (ref 13.0–17.0)
Lymphocytes Relative: 23.8 % (ref 12.0–46.0)
Lymphs Abs: 1.6 10*3/uL (ref 0.7–4.0)
MCHC: 34 g/dL (ref 30.0–36.0)
MCV: 98.8 fl (ref 78.0–100.0)
Monocytes Absolute: 0.6 10*3/uL (ref 0.1–1.0)
Monocytes Relative: 9.6 % (ref 3.0–12.0)
Neutro Abs: 4.2 10*3/uL (ref 1.4–7.7)
Neutrophils Relative %: 63 % (ref 43.0–77.0)
Platelets: 205 10*3/uL (ref 150.0–400.0)
RBC: 4.55 Mil/uL (ref 4.22–5.81)
RDW: 13.1 % (ref 11.5–15.5)
WBC: 6.6 10*3/uL (ref 4.0–10.5)

## 2021-12-31 LAB — LDL CHOLESTEROL, DIRECT: Direct LDL: 116 mg/dL

## 2021-12-31 LAB — VITAMIN B12: Vitamin B-12: 519 pg/mL (ref 211–911)

## 2021-12-31 NOTE — Progress Notes (Signed)
Phone: [phone]   Subjective:  Patient presents today for their annual physical. Chief complaint-noted.   See problem oriented charting- ROS- full  review of systems was completed and negative  except for: raspy voice  The following were reviewed and entered/updated in epic: Past Medical History:  Diagnosis Date   History of basal cell carcinoma 06/16/2014   2013 Left ear    History of colonic polyps 06/16/2014   Adenoma 2010. Advised 5 year follow up.     Patient Active Problem List   Diagnosis Date Noted   Acute blood loss anemia     Priority: High   Duodenal ulcer hemorrhagic     Priority: High   Pre-syncope 12/16/2019    Priority: Medium    Hyperlipidemia 07/17/2019    Priority: Medium    History of colonic polyps 06/16/2014    Priority: Medium    History of basal cell carcinoma 06/16/2014    Priority: Low   Past Surgical History:  Procedure Laterality Date   BIOPSY  12/17/2019   Procedure: BIOPSY;  Surgeon: Irene Shipper, MD;  [facility];  Service: Endoscopy;;   ESOPHAGOGASTRODUODENOSCOPY (EGD) WITH PROPOFOL N/A 12/17/2019   Procedure: ESOPHAGOGASTRODUODENOSCOPY (EGD) WITH PROPOFOL;  Surgeon: Irene Shipper, MD;  [facility];  Service: Endoscopy;  Laterality: N/A;   HEMOSTASIS CLIP PLACEMENT  12/17/2019   Procedure: HEMOSTASIS CLIP PLACEMENT;  Surgeon: Irene Shipper, MD;  [facility];  Service: Endoscopy;;   HEMOSTASIS CONTROL  12/17/2019   Procedure: HEMOSTASIS CONTROL;  Surgeon: Irene Shipper, MD;  [facility];  Service: Endoscopy;;   left eye cataract removal      Family History  Problem Relation Age of Onset   Stroke Mother        52, nonsmoker. lived to 11   Tuberculosis Father        died 3 months before patient born    Medications- reviewed and updated Current Outpatient Medications  Medication Sig Dispense Refill   Cholecalciferol (VITAMIN D-3 PO) Take 1 tablet by mouth daily.      co-enzyme Q-10 30 MG  capsule Take 30 mg by mouth daily.      Garlic 10 MG CAPS Take 1 capsule by mouth daily.      Multiple Vitamins-Minerals (EYE VITAMINS PO) Take 1 tablet by mouth daily.      Omega-3 Fatty Acids (FISH OIL) 1000 MG CPDR Take 1 capsule by mouth daily.      pantoprazole (PROTONIX) 40 MG tablet Take 1 tablet (40 mg total) by mouth daily. 90 tablet 3   RESVERATROL 100 MG CAPS Take 1 capsule by mouth daily.      No current facility-administered medications for this visit.    Allergies-reviewed and updated No Known Allergies  Social History   Social History Narrative   Married (wife Arbie Cookey patient of Dr. Yong Channel). 1 son. No grandchildren.       Retired from Public relations account executive: exercise, see movies, plays and musicals   Objective  Objective:  BP 130/68   Pulse (!) 56   Temp 98.3 F (36.8 C)   Ht _0  (1.803 m)   Wt 152 lb 9.6 oz (69.2 kg)   SpO2 98%   BMI 21.28 kg/m  Gen: NAD, resting comfortably HEENT: Mucous membranes are moist. Oropharynx normal Neck: no thyromegaly CV: RRR no murmurs rubs or gallops Lungs: CTAB no crackles, wheeze, rhonchi Abdomen: soft/nontender/nondistended/normal bowel sounds. No rebound or guarding.  Ext: no edema Skin: warm, dry Neuro: grossly normal, moves all extremities, PERRLA    Assessment and Plan  88 y.o. male presenting for annual physical.  Health Maintenance counseling: 1. Anticipatory guidance: Patient counseled regarding regular dental exams -q6 months, eye exams -yearly,  avoiding smoking and second hand smoke, limiting alcohol to 2 beverages per day - doesn't drink, no illicit drugs.   2. Risk factor reduction:  Advised patient of need for regular exercise and diet rich and fruits and vegetables to reduce risk of heart attack and stroke.  Exercise-remains very active-walking regularly when able. While still in bed does stretches in the morning, then planks for a minute, does 20 push ups and 20 squats. Does kettlebell  some- no issues with tthis and no fall issues Diet/weight management-down 9 lbs since last year- feels has reduced some snacking- advised no further weight loss.  Wt Readings from Last 3 Encounters:  12/31/21 152 lb 9.6 oz (69.2 kg)  04/08/21 159 lb (72.1 kg)  09/14/20 154 lb (69.9 kg)   3. Immunizations/screenings/ancillary studies-discussed flu shot and COVID-19 vaccination once updated-likely October, discussed Tdap at pharmacy  Immunization History  Administered Date(s) Administered   Fluad Quad(high Dose 65+) 02/19/2019, 02/26/2020, 02/09/2021   Influenza Split 03/13/2012   Influenza Whole 04/06/2007, 03/11/2008, 03/23/2009, 02/17/2010   Influenza, High Dose Seasonal PF 02/16/2017, 02/15/2018   Influenza,inj,Quad PF,6+ Mos 02/19/2013, 02/10/2014, 03/16/2016   Influenza-Unspecified 02/25/2015   PFIZER(Purple Top)SARS-COV-2 Vaccination 07/04/2019, 07/29/2019, 03/11/2020, 09/03/2020   PNEUMOCOCCAL CONJUGATE-20 01/13/2021   Pneumococcal Conjugate-13 06/17/2014   Pneumococcal Polysaccharide-23 02/17/2004   Td 01/29/1999   Tdap 10/12/2011   Zoster Recombinat (Shingrix) 11/20/2019, 05/01/2020   Zoster, Live 05/30/2008  4. Prostate cancer screening- past age based screening recommendations  5. Colon cancer screening - history of adenomatous polyps but over age 48 benefits of colonoscopy thought to outweigh risks-and discontinued by GI 6. Skin cancer screening-no dermatologist. advised regular sunscreen use. Denies worrisome, changing, or new skin lesions.  7. Smoking associated screening (lung cancer screening, AAA screen 65-75, UA)-former smoker-quit in 1970s-no regular screenings required 8. STD screening - only active with wife  Status of chronic or acute concerns   #Social update-continues to care for wife Arbie Cookey with multiple myeloma- she is incredibly still mowing the grass.   #Raspy voice/hoarseness-has noted over the last 6 months- worse when smoke issues had been noted  in  particular - no clear cause on history or exam- will refer ENT  #hyperlipidemia S: Medication:None Lab Results  Component Value Date   CHOL 177 07/17/2020   HDL 53.50 07/17/2020   LDLCALC 85 07/17/2020   TRIG 192.0 (H) 07/17/2020   CHOLHDL 3 07/17/2020   A/P: Mild elevations-for primary prevention will not start statin over age 61   # history of duodenal ulcer leading to acute blood loss anemia S:Medication: Plan was for indefinite PPI given severity of symptoms. Protonix 40 mg twenty minutes before breakfast.  - no melena or BRBPR A/P: doing well without recurrence- continue current meds    Recommended follow up: Return in about 1 year (around 01/01/2023) for physical or sooner if needed.Schedule b4 you leave. Future Appointments  Date Time Provider [place]  09/30/2022  2:00 PM [facility] HEALTH COACH [facility]   Lab/Order associations:NOT fasting   ICD-10-CM   1. Preventative health care  Z00.00     2. Hyperlipidemia, unspecified hyperlipidemia type  E78.5     3. High risk medication use  Z79.899     4.  Hoarseness  R49.0      No orders of the defined types were placed in this encounter.  Return precautions advised.  Garret Reddish, MD

## 2021-12-31 NOTE — Patient Instructions (Addendum)
Flu shot- we should have these available within a month or two but please let us know if you get at outside pharmacy. Consider covid 19 injection in October.  -if you had either of these send me the date  Tdap due for this at pharmacy- cheaper there  We will call you within two weeks about your referral to ENT. If you do not hear within 2 weeks, give [place] a call.    Please stop by lab before you go If you have mychart- we will send your results within 3 business days of [place] receiving them.  If you do not have mychart- we will call you about results within 5 business days of [place] receiving them.  *please also note that you will see labs on mychart as soon as they post. I will later go in and write notes on them- will say "notes from Dr. Yong Channel"   Recommended follow up: Return in about 1 year (around 01/01/2023) for physical or sooner if needed.Schedule b4 you leave.

## 2022-02-17 ENCOUNTER — Ambulatory Visit (INDEPENDENT_AMBULATORY_CARE_PROVIDER_SITE_OTHER): Payer: Medicare HMO

## 2022-02-17 DIAGNOSIS — Z23 Encounter for immunization: Secondary | ICD-10-CM

## 2022-03-16 DIAGNOSIS — H5212 Myopia, left eye: Secondary | ICD-10-CM | POA: Diagnosis not present

## 2022-03-16 DIAGNOSIS — Z961 Presence of intraocular lens: Secondary | ICD-10-CM | POA: Diagnosis not present

## 2022-03-16 DIAGNOSIS — H35372 Puckering of macula, left eye: Secondary | ICD-10-CM | POA: Diagnosis not present

## 2022-09-14 ENCOUNTER — Telehealth: Payer: Self-pay | Admitting: Family Medicine

## 2022-09-14 NOTE — Telephone Encounter (Signed)
Contacted Christophe F [facility]entine to schedule tBillie Ruddy wellness visit. Appointment made for 09/15/2022.  Gabriel Cirri [facility] AWV TEAM Direct Dial [phone]

## 2022-09-15 ENCOUNTER — Ambulatory Visit (INDEPENDENT_AMBULATORY_CARE_PROVIDER_SITE_OTHER): Payer: Medicare HMO

## 2022-09-15 VITALS — Wt 158.0 lb

## 2022-09-15 DIAGNOSIS — Z0000 Encounter for general adult medical examination without abnormal findings: Secondary | ICD-10-CM

## 2022-09-15 NOTE — Progress Notes (Signed)
Subjective:   CLOYCE BLANKENHORN is a 89 y.o. male who presents for Medicare Annual/Subsequent preventive examination.  Review of Systems     Cardiac Risk Factors include: advanced age (>52men, >43 women);male gender;dyslipidemia     Objective:    Today's Vitals   09/15/22 0936  Weight: 158 lb (71.7 kg)   Body mass index is 22.04 kg/m.     09/15/2022    9:42 AM 09/15/2022    9:39 AM 09/17/2021    2:00 PM 09/14/2020    1:58 PM 07/04/2019    2:12 PM  Advanced Directives  Does Patient Have a Medical Advance Directive? Yes Yes Yes Yes Yes  Type of Estate agent of [place];Living will Healthcare Power of [place];Living will Healthcare Power of [place];Living will Healthcare Power of Attorney Living will;Healthcare Power of Attorney  Does patient want to make changes to medical advance directive?     No - Patient declined  Copy of Healthcare Power of Attorney in Chart? No - copy requested No - copy requested No - copy requested No - copy requested No - copy requested    Current Medications (verified) Outpatient Encounter Medications as of 09/15/2022  Medication Sig   AREXVY 120 MCG/0.5ML injection    Cholecalciferol (VITAMIN D-3 PO) Take 1 tablet by mouth daily.    co-enzyme Q-10 30 MG capsule Take 30 mg by mouth daily.    Garlic 10 MG CAPS Take 1 capsule by mouth daily.    Multiple Vitamins-Minerals (EYE VITAMINS PO) Take 1 tablet by mouth daily.    Omega-3 Fatty Acids (FISH OIL) 1000 MG CPDR Take 1 capsule by mouth daily.    pantoprazole (PROTONIX) 40 MG tablet Take 1 tablet (40 mg total) by mouth daily.   RESVERATROL 100 MG CAPS Take 1 capsule by mouth daily.    No facility-administered encounter medications on file as of 09/15/2022.    Allergies (verified) Patient has no known allergies.   History: Past Medical History:  Diagnosis Date   History of basal cell carcinoma 06/16/2014   2013 Left ear    History of colonic polyps 06/16/2014   Adenoma 2010.  Advised 5 year follow up.     Past Surgical History:  Procedure Laterality Date   BIOPSY  12/17/2019   Procedure: BIOPSY;  Surgeon: Hilarie Fredrickson, MD;  Location: [facility] ENDOSCOPY;  Service: Endoscopy;;   ESOPHAGOGASTRODUODENOSCOPY (EGD) WITH PROPOFOL N/A 12/17/2019   Procedure: ESOPHAGOGASTRODUODENOSCOPY (EGD) WITH PROPOFOL;  Surgeon: Hilarie Fredrickson, MD;  Location[facility] ENDOSCOPY;  Service: Endoscopy;  Laterality: N/A;   HEMOSTASIS CLIP PLACEMENT  12/17/2019   Procedure: HEMOSTASIS CLIP PLACEMENT;  Surgeon: Hilarie Fredrickson, MD;  Location: [facility] ENDOSCOPY;  Service: Endoscopy;;   HEMOSTASIS CONTROL  12/17/2019   Procedure: HEMOSTASIS CONTROL;  Surgeon: Hilarie Fredrickson, MD;  Location[facility] ENDOSCOPY;  Service: Endoscopy;;   left eye cataract removal     Family History  Problem Relation Age of Onset   Stroke Mother        46, nonsmoker. lived to 47   Tuberculosis Father        died 3 months before patient born   Social History   Socioeconomic History   Marital status: Married    Spouse name: Not on file   Number of children: 1   Years of education: Not on file   Highest education level: Not on file  Occupational History   Occupation: Retired     Comment: Public relations account executive   Tobacco Use  Smoking status: Former    Packs/day: 1.00    Years: 15.00    Additional pack years: 0.00    Total pack years: 15.00    Types: Cigarettes    Quit date: 09/07/1973    Years since quitting: 49.0   Smokeless tobacco: Never  Vaping Use   Vaping Use: Never used  Substance and Sexual Activity   Alcohol use: No    Alcohol/week: 0.0 standard drinks of alcohol   Drug use: No   Sexual activity: Not on file  Other Topics Concern   Not on file  Social History Narrative   Married (wife Okey Regal patient of Dr. Durene Cal). 1 son. No grandchildren.       Retired from Doctor, general practice: exercise, see movies, plays and musicals   Social Determinants of Health   Financial Resource Strain: Low Risk   (09/15/2022)   Overall Financial Resource Strain (CARDIA)    Difficulty of Paying Living Expenses: Not hard at all  Food Insecurity: No Food Insecurity (09/15/2022)   Hunger Vital Sign    Worried About Running Out of Food in the Last Year: Never true    Ran Out of Food in the Last Year: Never true  Transportation Needs: No Transportation Needs (09/15/2022)   PRAPARE - Administrator, Civil Service (Medical): No    Lack of Transportation (Non-Medical): No  Physical Activity: Insufficiently Active (09/15/2022)   Exercise Vital Sign    Days of Exercise per Week: 5 days    Minutes of Exercise per Session: 20 min  Stress: No Stress Concern Present (09/15/2022)   [employer] of Occupational Health - Occupational Stress Questionnaire    Feeling of Stress : Not at all  Social Connections: Moderately Integrated (09/15/2022)   Social Connection and Isolation Panel [NHANES]    Frequency of Communication with Friends and Family: Twice a week    Frequency of Social Gatherings with Friends and Family: Once a week    Attends Religious Services: More than 4 times per year    Active Member of [employer] or Organizations: No    Attends Engineer, structural: Never    Marital Status: Married    Tobacco Counseling Counseling given: Not Answered   Clinical Intake:  Pre-visit preparation completed: Yes  Pain : No/denies pain     BMI - recorded: 22.04 Nutritional Status: BMI of 19-24  Normal Nutritional Risks: None Diabetes: No  How often do you need to have someone help you when you read instructions, pamphlets, or other written materials from your doctor or pharmacy?: 1 - Never  Diabetic?no  Interpreter Needed?: No  Information entered by :: Lanier Ensign, LPN   Activities of Daily Living    09/15/2022    9:45 AM 09/17/2021    2:02 PM  In your present state of health, do you have any difficulty performing the following activities:  Hearing? 0 0  Vision? 0 0   Difficulty concentrating or making decisions? 0 0  Walking or climbing stairs? 0 0  Dressing or bathing? 0 0  Doing errands, shopping? 0 0  Preparing Food and eating ? N N  Using the Toilet? N N  In the past six months, have you accidently leaked urine? N N  Do you have problems with loss of bowel control? N N  Managing your Medications? N N  Managing your Finances? N N  Housekeeping or managing your Housekeeping? N N    Patient Care  Team: Shelva Majestic, MD as PCP - General (Family Medicine) Maris Berger, MD as Consulting Physician (Ophthalmology)  Indicate any recent Medical Services you may have received from other than [facility] providers in the past year (date may be approximate).     Assessment:   This is a routine wellness examination for Tharun.  Hearing/Vision screen Hearing Screening - Comments:: Pt denies any hearing issues  Vision Screening - Comments:: Pt follows up with Dr Charlotte Sanes for annual eye exams   Dietary issues and exercise activities discussed: Current Exercise Habits: Home exercise routine, Type of exercise: walking;Other - see comments, Time (Minutes): 20, Frequency (Times/Week): 5, Weekly Exercise (Minutes/Week): 100   Goals Addressed             This Visit's Progress    Patient Stated       Stay healthy        Depression Screen    09/15/2022    9:40 AM 09/17/2021    1:59 PM 09/14/2020    1:54 PM 07/17/2020   10:33 AM 07/17/2019    9:07 AM 07/04/2019    2:13 PM 03/10/2017   10:56 AM  PHQ 2/9 Scores  PHQ - 2 Score 0 0 0 0 0 0 0    Fall Risk    09/15/2022    9:45 AM 09/17/2021    2:02 PM 09/14/2020    1:59 PM 07/17/2020   10:33 AM 07/04/2019    2:13 PM  Fall Risk   Falls in the past year? 0 0 0 0 0  Number falls in past yr: 0 0 0 0 0  Injury with Fall? 0 0 0 0 0  Risk for fall due to : Impaired balance/gait      Follow up Falls prevention discussed  Falls prevention discussed  Falls evaluation completed;Falls prevention  discussed;Education provided    FALL RISK PREVENTION PERTAINING TO THE HOME:  Any stairs in or around the home? Yes  If so, are there any without handrails? No  Home free of loose throw rugs in walkways, pet beds, electrical cords, etc? Yes  Adequate lighting in your home to reduce risk of falls? Yes   ASSISTIVE DEVICES UTILIZED TO PREVENT FALLS:  Life alert? Yes  Use of a cane, walker or w/c? No  Grab bars in the bathroom? Yes  Shower chair or bench in shower? Yes  Elevated toilet seat or a handicapped toilet? Yes   TIMED UP AND GO:  Was the test performed? No .   Cognitive Function:        09/15/2022    9:46 AM 09/17/2021    2:03 PM 09/14/2020    2:01 PM 07/04/2019    2:12 PM  6CIT Screen  What Year? 0 points 0 points 0 points 0 points  What month? 0 points 0 points 0 points 0 points  What time? 0 points 0 points  0 points  Count back from 20 0 points 0 points 0 points 0 points  Months in reverse 2 points 0 points 0 points 0 points  Repeat phrase 0 points 2 points 2 points   Total Score 2 points 2 points      Immunizations Immunization History  Administered Date(s) Administered   Fluad Quad(high Dose 65+) 02/19/2019, 02/26/2020, 02/09/2021, 02/17/2022   Influenza Split 03/13/2012   Influenza Whole 04/06/2007, 03/11/2008, 03/23/2009, 02/17/2010   Influenza, High Dose Seasonal PF 02/16/2017, 02/15/2018   Influenza,inj,Quad PF,6+ Mos 02/19/2013, 02/10/2014, 03/16/2016   Influenza-Unspecified 02/25/2015  PFIZER(Purple Top)SARS-COV-2 Vaccination 07/04/2019, 07/29/2019, 03/11/2020, 09/03/2020   PNEUMOCOCCAL CONJUGATE-20 01/13/2021   Pneumococcal Conjugate-13 06/17/2014   Pneumococcal Polysaccharide-23 02/17/2004   Respiratory Syncytial Virus Vaccine,Recomb Aduvanted(Arexvy) 04/04/2022   Td 01/29/1999   Tdap 10/12/2011   Zoster Recombinat (Shingrix) 11/20/2019, 05/01/2020   Zoster, Live 05/30/2008    TDAP status: Up to date  Flu Vaccine status: Up to  date  Pneumococcal vaccine status: Up to date  Covid-19 vaccine status: Completed vaccines  Qualifies for Shingles Vaccine? Yes   Zostavax completed Yes   Shingrix Completed?: Yes  Screening Tests Health Maintenance  Topic Date Due   DTaP/Tdap/Td (3 - Td or Tdap) 10/11/2021   COVID-19 Vaccine (5 - 2023-24 season) 01/28/2022   INFLUENZA VACCINE  12/29/2022   Medicare Annual Wellness (AWV)  09/15/2023   Pneumonia Vaccine 71+ Years old  Completed   Zoster Vaccines- Shingrix  Completed   HPV VACCINES  Aged Out    Health Maintenance  Health Maintenance Due  Topic Date Due   DTaP/Tdap/Td (3 - Td or Tdap) 10/11/2021   COVID-19 Vaccine (5 - 2023-24 season) 01/28/2022    Colorectal cancer screening: No longer required.    Additional Screening:  Vision Screening: Recommended annual ophthalmology exams for early detection of glaucoma and other disorders of the eye. Is the patient up to date with their annual eye exam?  Yes  Who is the provider or what is the name of the office in which the patient attends annual eye exams? Dr Charlotte Sanes If pt is not established with a provider, would they like to be referred to a provider to establish care? No .   Dental Screening: Recommended annual dental exams for proper oral hygiene  Community Resource Referral / Chronic Care Management: CRR required this visit?  No   CCM required this visit?  No      Plan:     I have personally reviewed and noted the following in the patient's chart:   Medical and social history Use of alcohol, tobacco or illicit drugs  Current medications and supplements including opioid prescriptions. Patient is not currently taking opioid prescriptions. Functional ability and status Nutritional status Physical activity Advanced directives List of other physicians Hospitalizations, surgeries, and ER visits in previous 12 months Vitals Screenings to include cognitive, depression, and falls Referrals and  appointments  In addition, I have reviewed and discussed with patient certain preventive protocols, quality metrics, and best practice recommendations. A written personalized care plan for preventive services as well as general preventive health recommendations were provided to patient.     Marzella Schlein, LPN   [id number]/61/0960   Nurse Notes: none

## 2022-09-19 NOTE — Progress Notes (Signed)
I connected with  Brian Welch on 09/19/22 by a audio enabled telemedicine application and verified that I am speaking with the correct person using two identifiers.  Patient Location: Home  Provider Location: Office/Clinic  I discussed the limitations of evaluation and management by telemedicine. The patient expressed understanding and agreed to proceed.   Subjective:   Brian Welch is a 89 y.o. male who presents for Medicare Annual/Subsequent preventive examination.  Review of Systems     Cardiac Risk Factors include: advanced age (>38men, >70 women);male gender;dyslipidemia     Objective:    Today's Vitals   09/15/22 0936  Weight: 158 lb (71.7 kg)   Body mass index is 22.04 kg/m.     09/15/2022    9:42 AM 09/15/2022    9:39 AM 09/17/2021    2:00 PM 09/14/2020    1:58 PM 07/04/2019    2:12 PM  Advanced Directives  Does Patient Have a Medical Advance Directive? Yes Yes Yes Yes Yes  Type of Estate agent of [place];Living will Healthcare Power of [place];Living will Healthcare Power of [place];Living will Healthcare Power of Attorney Living will;Healthcare Power of Attorney  Does patient want to make changes to medical advance directive?     No - Patient declined  Copy of Healthcare Power of Attorney in Chart? No - copy requested No - copy requested No - copy requested No - copy requested No - copy requested    Current Medications (verified) Outpatient Encounter Medications as of 09/15/2022  Medication Sig   AREXVY 120 MCG/0.5ML injection    Cholecalciferol (VITAMIN D-3 PO) Take 1 tablet by mouth daily.    co-enzyme Q-10 30 MG capsule Take 30 mg by mouth daily.    Garlic 10 MG CAPS Take 1 capsule by mouth daily.    Multiple Vitamins-Minerals (EYE VITAMINS PO) Take 1 tablet by mouth daily.    Omega-3 Fatty Acids (FISH OIL) 1000 MG CPDR Take 1 capsule by mouth daily.    pantoprazole (PROTONIX) 40 MG tablet Take 1 tablet (40 mg total) by mouth daily.    RESVERATROL 100 MG CAPS Take 1 capsule by mouth daily.    No facility-administered encounter medications on file as of 09/15/2022.    Allergies (verified) Patient has no known allergies.   History: Past Medical History:  Diagnosis Date   History of basal cell carcinoma 06/16/2014   2013 Left ear    History of colonic polyps 06/16/2014   Adenoma 2010. Advised 5 year follow up.     Past Surgical History:  Procedure Laterality Date   BIOPSY  12/17/2019   Procedure: BIOPSY;  Surgeon: Hilarie Fredrickson, MD;  Location: [facility] ENDOSCOPY;  Service: Endoscopy;;   ESOPHAGOGASTRODUODENOSCOPY (EGD) WITH PROPOFOL N/A 12/17/2019   Procedure: ESOPHAGOGASTRODUODENOSCOPY (EGD) WITH PROPOFOL;  Surgeon: Hilarie Fredrickson, MD;  Location: [facility] ENDOSCOPY;  Service: Endoscopy;  Laterality: N/A;   HEMOSTASIS CLIP PLACEMENT  12/17/2019   Procedure: HEMOSTASIS CLIP PLACEMENT;  Surgeon: Hilarie Fredrickson, MD;  Location: [facility] ENDOSCOPY;  Service: Endoscopy;;   HEMOSTASIS CONTROL  12/17/2019   Procedure: HEMOSTASIS CONTROL;  Surgeon: Hilarie Fredrickson, MD;  Location: [facility] ENDOSCOPY;  Service: Endoscopy;;   left eye cataract removal     Family History  Problem Relation Age of Onset   Stroke Mother        88, nonsmoker. lived to 81   Tuberculosis Father        died 3 months before patient born   Social History  Socioeconomic History   Marital status: Married    Spouse name: Not on file   Number of children: 1   Years of education: Not on file   Highest education level: Not on file  Occupational History   Occupation: Retired     Comment: Public relations account executive   Tobacco Use   Smoking status: Former    Packs/day: 1.00    Years: 15.00    Additional pack years: 0.00    Total pack years: 15.00    Types: Cigarettes    Quit date: 09/07/1973    Years since quitting: 49.0   Smokeless tobacco: Never  Vaping Use   Vaping Use: Never used  Substance and Sexual Activity   Alcohol use: No    Alcohol/week: 0.0 standard drinks of alcohol    Drug use: No   Sexual activity: Not on file  Other Topics Concern   Not on file  Social History Narrative   Married (wife Okey Regal patient of Dr. Durene Cal). 1 son. No grandchildren.       Retired from Doctor, general practice: exercise, see movies, plays and musicals   Social Determinants of Health   Financial Resource Strain: Low Risk  (09/15/2022)   Overall Financial Resource Strain (CARDIA)    Difficulty of Paying Living Expenses: Not hard at all  Food Insecurity: No Food Insecurity (09/15/2022)   Hunger Vital Sign    Worried About Running Out of Food in the Last Year: Never true    Ran Out of Food in the Last Year: Never true  Transportation Needs: No Transportation Needs (09/15/2022)   PRAPARE - Administrator, Civil Service (Medical): No    Lack of Transportation (Non-Medical): No  Physical Activity: Insufficiently Active (09/15/2022)   Exercise Vital Sign    Days of Exercise per Week: 5 days    Minutes of Exercise per Session: 20 min  Stress: No Stress Concern Present (09/15/2022)   [employer] of Occupational Health - Occupational Stress Questionnaire    Feeling of Stress : Not at all  Social Connections: Moderately Integrated (09/15/2022)   Social Connection and Isolation Panel [NHANES]    Frequency of Communication with Friends and Family: Twice a week    Frequency of Social Gatherings with Friends and Family: Once a week    Attends Religious Services: More than 4 times per year    Active Member of [employer] or Organizations: No    Attends Engineer, structural: Never    Marital Status: Married    Tobacco Counseling Counseling given: Not Answered   Clinical Intake:  Pre-visit preparation completed: Yes  Pain : No/denies pain     BMI - recorded: 22.04 Nutritional Status: BMI of 19-24  Normal Nutritional Risks: None Diabetes: No  How often do you need to have someone help you when you read instructions, pamphlets, or other  written materials from your doctor or pharmacy?: 1 - Never  Diabetic?no  Interpreter Needed?: No  Information entered by :: Lanier Ensign, LPN   Activities of Daily Living    09/15/2022    9:45 AM  In your present state of health, do you have any difficulty performing the following activities:  Hearing? 0  Vision? 0  Difficulty concentrating or making decisions? 0  Walking or climbing stairs? 0  Dressing or bathing? 0  Doing errands, shopping? 0  Preparing Food and eating ? N  Using the Toilet? N  In the past six months, have you  accidently leaked urine? N  Do you have problems with loss of bowel control? N  Managing your Medications? N  Managing your Finances? N  Housekeeping or managing your Housekeeping? N    Patient Care Team: Shelva Majestic, MD as PCP - General (Family Medicine) Maris Berger, MD as Consulting Physician (Ophthalmology)  Indicate any recent Medical Services you may have received from other than [facility] providers in the past year (date may be approximate).     Assessment:   This is a routine wellness examination for Zen.  Hearing/Vision screen Hearing Screening - Comments:: Pt denies any hearing issues  Vision Screening - Comments:: Pt follows up with Dr Charlotte Sanes for annual eye exams   Dietary issues and exercise activities discussed: Current Exercise Habits: Home exercise routine, Type of exercise: walking;Other - see comments, Time (Minutes): 20, Frequency (Times/Week): 5, Weekly Exercise (Minutes/Week): 100   Goals Addressed             This Visit's Progress    Patient Stated       Stay healthy       Depression Screen    09/15/2022    9:40 AM 09/17/2021    1:59 PM 09/14/2020    1:54 PM 07/17/2020   10:33 AM 07/17/2019    9:07 AM 07/04/2019    2:13 PM 03/10/2017   10:56 AM  PHQ 2/9 Scores  PHQ - 2 Score 0 0 0 0 0 0 0    Fall Risk    09/15/2022    9:45 AM 09/17/2021    2:02 PM 09/14/2020    1:59 PM 07/17/2020   10:33 AM  07/04/2019    2:13 PM  Fall Risk   Falls in the past year? 0 0 0 0 0  Number falls in past yr: 0 0 0 0 0  Injury with Fall? 0 0 0 0 0  Risk for fall due to : Impaired balance/gait      Follow up Falls prevention discussed  Falls prevention discussed  Falls evaluation completed;Falls prevention discussed;Education provided    FALL RISK PREVENTION PERTAINING TO THE HOME:  Any stairs in or around the home? Yes  If so, are there any without handrails? No  Home free of loose throw rugs in walkways, pet beds, electrical cords, etc? Yes  Adequate lighting in your home to reduce risk of falls? Yes   ASSISTIVE DEVICES UTILIZED TO PREVENT FALLS:  Life alert? Yes  Use of a cane, walker or w/c? No  Grab bars in the bathroom? Yes  Shower chair or bench in shower? Yes  Elevated toilet seat or a handicapped toilet? Yes   TIMED UP AND GO:  Was the test performed? No .   Cognitive Function:        09/15/2022    9:46 AM 09/17/2021    2:03 PM 09/14/2020    2:01 PM 07/04/2019    2:12 PM  6CIT Screen  What Year? 0 points 0 points 0 points 0 points  What month? 0 points 0 points 0 points 0 points  What time? 0 points 0 points  0 points  Count back from 20 0 points 0 points 0 points 0 points  Months in reverse 2 points 0 points 0 points 0 points  Repeat phrase 0 points 2 points 2 points   Total Score 2 points 2 points      Immunizations Immunization History  Administered Date(s) Administered   Fluad Quad(high Dose 65+) 02/19/2019, 02/26/2020, 02/09/2021, 02/17/2022  Influenza Split 03/13/2012   Influenza Whole 04/06/2007, 03/11/2008, 03/23/2009, 02/17/2010   Influenza, High Dose Seasonal PF 02/16/2017, 02/15/2018   Influenza,inj,Quad PF,6+ Mos 02/19/2013, 02/10/2014, 03/16/2016   Influenza-Unspecified 02/25/2015   PFIZER(Purple Top)SARS-COV-2 Vaccination 07/04/2019, 07/29/2019, 03/11/2020, 09/03/2020   PNEUMOCOCCAL CONJUGATE-20 01/13/2021   Pneumococcal Conjugate-13 06/17/2014    Pneumococcal Polysaccharide-23 02/17/2004   Respiratory Syncytial Virus Vaccine,Recomb Aduvanted(Arexvy) 04/04/2022   Td 01/29/1999   Tdap 10/12/2011   Zoster Recombinat (Shingrix) 11/20/2019, 05/01/2020   Zoster, Live 05/30/2008    TDAP status: Up to date  Flu Vaccine status: Up to date  Pneumococcal vaccine status: Up to date  Covid-19 vaccine status: Completed vaccines  Qualifies for Shingles Vaccine? Yes   Zostavax completed Yes   Shingrix Completed?: Yes  Screening Tests Health Maintenance  Topic Date Due   DTaP/Tdap/Td (3 - Td or Tdap) 10/11/2021   COVID-19 Vaccine (5 - 2023-24 season) 01/28/2022   INFLUENZA VACCINE  12/29/2022   Medicare Annual Wellness (AWV)  09/15/2023   Pneumonia Vaccine 62+ Years old  Completed   Zoster Vaccines- Shingrix  Completed   HPV VACCINES  Aged Out    Health Maintenance  Health Maintenance Due  Topic Date Due   DTaP/Tdap/Td (3 - Td or Tdap) 10/11/2021   COVID-19 Vaccine (5 - 2023-24 season) 01/28/2022    Colorectal cancer screening: No longer required.    Additional Screening:  Vision Screening: Recommended annual ophthalmology exams for early detection of glaucoma and other disorders of the eye. Is the patient up to date with their annual eye exam?  Yes  Who is the provider or what is the name of the office in which the patient attends annual eye exams? Dr Charlotte Sanes If pt is not established with a provider, would they like to be referred to a provider to establish care? No .   Dental Screening: Recommended annual dental exams for proper oral hygiene  Community Resource Referral / Chronic Care Management: CRR required this visit?  No   CCM required this visit?  No      Plan:     I have personally reviewed and noted the following in the patient's chart:   Medical and social history Use of alcohol, tobacco or illicit drugs  Current medications and supplements including opioid prescriptions. Patient is not currently  taking opioid prescriptions. Functional ability and status Nutritional status Physical activity Advanced directives List of other physicians Hospitalizations, surgeries, and ER visits in previous 12 months Vitals Screenings to include cognitive, depression, and falls Referrals and appointments  In addition, I have reviewed and discussed with patient certain preventive protocols, quality metrics, and best practice recommendations. A written personalized care plan for preventive services as well as general preventive health recommendations were provided to patient.     Marzella Schlein, LPN   [id number]/60/[id number]   Nurse Notes: none

## 2022-09-27 ENCOUNTER — Other Ambulatory Visit: Payer: Self-pay | Admitting: Family Medicine

## 2023-01-03 ENCOUNTER — Ambulatory Visit (INDEPENDENT_AMBULATORY_CARE_PROVIDER_SITE_OTHER): Payer: Medicare HMO | Admitting: Family Medicine

## 2023-01-03 ENCOUNTER — Encounter: Payer: Self-pay | Admitting: Family Medicine

## 2023-01-03 VITALS — BP 124/72 | HR 61 | Temp 98.0°F | Ht 71.0 in | Wt 153.4 lb

## 2023-01-03 DIAGNOSIS — Z0000 Encounter for general adult medical examination without abnormal findings: Secondary | ICD-10-CM

## 2023-01-03 DIAGNOSIS — E785 Hyperlipidemia, unspecified: Secondary | ICD-10-CM

## 2023-01-03 DIAGNOSIS — Z79899 Other long term (current) drug therapy: Secondary | ICD-10-CM

## 2023-01-03 MED ORDER — PANTOPRAZOLE SODIUM 40 MG PO TBEC: 40.0000 mg | DELAYED_RELEASE_TABLET | Freq: Every day | ORAL | 3 refills | Status: DC

## 2023-01-03 NOTE — Patient Instructions (Addendum)
Let us know if you get your flu, COVID or TDap vaccine at the pharmacy. -with how active you are- definitely get Tdap - Tetanus, Diphtheria, and Pertussis (Tdap) as soon as possible   Please stop by lab before you go If you have mychart- we will send your results within 3 business days of [place] receiving them.  If you do not have mychart- we will call you about results within 5 business days of [place] receiving them.  *please also note that you will see labs on mychart as soon as they post. I will later go in and write notes on them- will say "notes from Dr. Durene Cal"    Recommended follow up: Return in about 1 year (around 01/03/2024) for physical or sooner if needed.Schedule b4 you leave.

## 2023-01-03 NOTE — Progress Notes (Signed)
Phone: [phone]   Subjective:  Patient presents today for their annual physical. Chief complaint-noted.   See problem oriented charting- ROS- full  review of systems was completed and negative  Per full ROS sheet completed by patient  The following were reviewed and entered/updated in epic: Past Medical History:  Diagnosis Date   History of basal cell carcinoma 06/16/2014   2013 Left ear    History of colonic polyps 06/16/2014   Adenoma 2010. Advised 5 year follow up.     Patient Active Problem List   Diagnosis Date Noted   Acute blood loss anemia     Priority: High   Duodenal ulcer hemorrhagic     Priority: High   Pre-syncope 12/16/2019    Priority: Medium    Hyperlipidemia 07/17/2019    Priority: Medium    History of colonic polyps 06/16/2014    Priority: Medium    History of basal cell carcinoma 06/16/2014    Priority: Low   Past Surgical History:  Procedure Laterality Date   BIOPSY  12/17/2019   Procedure: BIOPSY;  Surgeon: Hilarie Fredrickson, MD;  Location[facility] ENDOSCOPY;  Service: Endoscopy;;   ESOPHAGOGASTRODUODENOSCOPY (EGD) WITH PROPOFOL N/A 12/17/2019   Procedure: ESOPHAGOGASTRODUODENOSCOPY (EGD) WITH PROPOFOL;  Surgeon: Hilarie Fredrickson, MD;  Location: [facility] ENDOSCOPY;  Service: Endoscopy;  Laterality: N/A;   HEMOSTASIS CLIP PLACEMENT  12/17/2019   Procedure: HEMOSTASIS CLIP PLACEMENT;  Surgeon: Hilarie Fredrickson, MD;  Location: [facility] ENDOSCOPY;  Service: Endoscopy;;   HEMOSTASIS CONTROL  12/17/2019   Procedure: HEMOSTASIS CONTROL;  Surgeon: Hilarie Fredrickson, MD;  Location: [facility] ENDOSCOPY;  Service: Endoscopy;;   left eye cataract removal      Family History  Problem Relation Age of Onset   Stroke Mother        51, nonsmoker. lived to 1   Tuberculosis Father        died 3 months before patient born    Medications- reviewed and updated Current Outpatient Medications  Medication Sig Dispense Refill   Cholecalciferol (VITAMIN D-3 PO) Take 1 tablet by mouth daily.       co-enzyme Q-10 30 MG capsule Take 30 mg by mouth daily.      Garlic 10 MG CAPS Take 1 capsule by mouth daily.      Multiple Vitamins-Minerals (EYE VITAMINS PO) Take 1 tablet by mouth daily.      Omega-3 Fatty Acids (FISH OIL) 1000 MG CPDR Take 1 capsule by mouth daily.      RESVERATROL 100 MG CAPS Take 1 capsule by mouth daily.      pantoprazole (PROTONIX) 40 MG tablet Take 1 tablet (40 mg total) by mouth daily. 90 tablet 3   No current facility-administered medications for this visit.    Allergies-reviewed and updated No Known Allergies  Social History   Social History Narrative   Married (wife Okey Regal patient of Dr. Durene Cal). 1 son. No grandchildren.       Retired from Doctor, general practice: exercise, see movies, plays and musicals   Objective  Objective:  BP 124/72   Pulse 61   Temp 98 F (36.7 C)   Ht 5\' 11"  (1.803 m)   Wt 153 lb 6.4 oz (69.6 kg)   SpO2 97%   BMI 21.39 kg/m  Gen: NAD, resting comfortably, appears and moves much younger than stated age HEENT: Mucous membranes are moist. Oropharynx normal Neck: no thyromegaly CV: RRR no murmurs rubs or gallops Lungs: CTAB no crackles,  wheeze, rhonchi Abdomen: soft/nontender/nondistended/normal bowel sounds. No rebound or guarding.  Ext: no edema Skin: warm, dry Neuro: grossly normal, moves all extremities, PERRLA    Assessment and Plan  89 y.o. male presenting for annual physical.  Health Maintenance counseling: 1. Anticipatory guidance: Patient counseled regarding regular dental exams -q6 months, eye exams -yearly,  avoiding smoking and second hand smoke , limiting alcohol to 2 beverages per day -doesn't drink, no illicit drugs .   2. Risk factor reduction:  Advised patient of need for regular exercise and diet rich and fruits and vegetables to reduce risk of heart attack and stroke.  Exercise-remains very active-continues to walk regularly when able and while still in the bed stretching before he  gets up as well as some planks, push-ups and squats.  Even still tolerate some kettle bell use! Amazing! .  Diet/weight management-thankfully weight stabilized after mild loss last year.  Wt Readings from Last 3 Encounters:  01/03/23 153 lb 6.4 oz (69.6 kg)  09/15/22 158 lb (71.7 kg)  12/31/21 152 lb 9.6 oz (69.2 kg)  3. Immunizations/screenings/ancillary studies-recommended Tdap, COVID and flu shot-COVID and flu in the fall Immunization History  Administered Date(s) Administered   Fluad Quad(high Dose 65+) 02/19/2019, 02/26/2020, 02/09/2021, 02/17/2022   Influenza Split 03/13/2012   Influenza Whole 04/06/2007, 03/11/2008, 03/23/2009, 02/17/2010   Influenza, High Dose Seasonal PF 02/16/2017, 02/15/2018   Influenza,inj,Quad PF,6+ Mos 02/19/2013, 02/10/2014, 03/16/2016   Influenza-Unspecified 02/25/2015   PFIZER(Purple Top)SARS-COV-2 Vaccination 07/04/2019, 07/29/2019, 03/11/2020, 09/03/2020   PNEUMOCOCCAL CONJUGATE-20 01/13/2021   Pneumococcal Conjugate-13 06/17/2014   Pneumococcal Polysaccharide-23 02/17/2004   Respiratory Syncytial Virus Vaccine,Recomb Aduvanted(Arexvy) 04/04/2022   Td 01/29/1999   Tdap 10/12/2011   Zoster Recombinant(Shingrix) 11/20/2019, 05/01/2020   Zoster, Live 05/30/2008   4. Prostate cancer screening-  past age based screening recommendations  5. Colon cancer screening - history of adenomatous polyps but over age 89 benefits of colonoscopy thought to outweigh risks and discontinued by gastroenterology 6. Skin cancer screening-no dermatologist. advised regular sunscreen use. Denies worrisome, changing, or new skin lesions.  7. Smoking associated screening (lung cancer screening, AAA screen 65-75, UA)-quit in 1970s-former smoker-no regular screenings required 8. STD screening -only active with his wife  Status of chronic or acute concerns   # Social update-continues to care for his wife Okey Regal with multiple myeloma. Has a vegetable garden and battling with deer  and squirrels! Stays busy gardening  # Raspy voice/hoarseness-referred to ear nose and throat last year- he didn't see anyone - he declines referral this year- no worse   #hyperlipidemia S: Medication: None other than fish oil and coenzyme every 10 Lab Results  Component Value Date   CHOL 173 12/31/2021   HDL 41.40 12/31/2021   LDLCALC 85 07/17/2020   LDLDIRECT 116.0 12/31/2021   TRIG 328.0 (H) 12/31/2021   CHOLHDL 4 12/31/2021   A/P: Mild elevations and at his age for primary prevention would not start statin   # History of duodenal ulcer leading to acute blood loss anemia-indefinite PPI use given severity of her symptoms-refill pantoprazole today.  Monitor B12 with long-term PPI use. -denies reflux. No blood in stool.   Recommended follow up: Return in about 1 year (around 01/03/2024) for physical or sooner if needed.Schedule b4 you leave. Future Appointments  Date Time Provider Department Center  09/21/2023  9:30 AM [facility] ANNUAL WELLNESS VISIT 1 [facility]   Lab/Order associations:NOT  fasting   ICD-10-CM   1. Routine general medical examination at a health care facility  Z00.00     2. Hyperlipidemia, unspecified hyperlipidemia type  E78.5 Comprehensive metabolic panel    CBC with Differential/Platelet    Lipid panel    3. High risk medication use  Z79.899 Vitamin B12      Meds ordered this encounter  Medications   pantoprazole (PROTONIX) 40 MG tablet    Sig: Take 1 tablet (40 mg total) by mouth daily.    Dispense:  90 tablet    Refill:  3    Return precautions advised.  Tana Conch, MD

## 2023-03-22 DIAGNOSIS — H35372 Puckering of macula, left eye: Secondary | ICD-10-CM | POA: Diagnosis not present

## 2023-03-22 DIAGNOSIS — Z961 Presence of intraocular lens: Secondary | ICD-10-CM | POA: Diagnosis not present

## 2023-09-20 ENCOUNTER — Ambulatory Visit (INDEPENDENT_AMBULATORY_CARE_PROVIDER_SITE_OTHER)

## 2023-09-20 VITALS — Ht 70.0 in | Wt 153.0 lb

## 2023-09-20 DIAGNOSIS — Z0000 Encounter for general adult medical examination without abnormal findings: Secondary | ICD-10-CM

## 2023-09-20 NOTE — Progress Notes (Signed)
 Subjective:   JAHSEH LUCCHESE is a 89 y.o. who presents for a Medicare Wellness preventive visit.  Visit Complete: Virtual I connected with  Ananias Balls on 09/20/23 by a audio enabled telemedicine application and verified that I am speaking with the correct person using two identifiers.  Patient Location: Home  Provider Location: Home Office  I discussed the limitations of evaluation and management by telemedicine. The patient expressed understanding and agreed to proceed.  Vital Signs: Because this visit was a virtual/telehealth visit, some criteria may be missing or patient reported. Any vitals not documented were not able to be obtained and vitals that have been documented are patient reported.  VideoDeclined- This patient declined Librarian, academic. Therefore the visit was completed with audio only.  Persons Participating in Visit: Patient.  AWV Questionnaire: No: Patient Medicare AWV questionnaire was not completed prior to this visit.  Cardiac Risk Factors include: advanced age (>3men, >66 women);dyslipidemia;male gender     Objective:    Today's Vitals   09/20/23 0921  Weight: 153 lb (69.4 kg)  Height: 5\' 10"  (1.778 m)   Body mass index is 21.95 kg/m.     09/20/2023    9:25 AM 09/15/2022    9:42 AM 09/15/2022    9:39 AM 09/17/2021    2:00 PM 09/14/2020    1:58 PM 07/04/2019    2:12 PM  Advanced Directives  Does Patient Have a Medical Advance Directive? No Yes Yes Yes Yes Yes  Type of Special educational needs teacher of [place];Living will Healthcare Power of [place];Living will Healthcare Power of [place];Living will Healthcare Power of Attorney Living will;Healthcare Power of Attorney  Does patient want to make changes to medical advance directive?      No - Patient declined  Copy of Healthcare Power of Attorney in Chart?  No - copy requested No - copy requested No - copy requested No - copy requested No - copy requested  Would  patient like information on creating a medical advance directive? No - Patient declined         Current Medications (verified) Outpatient Encounter Medications as of 09/20/2023  Medication Sig   Cholecalciferol (VITAMIN D-3 PO) Take 1 tablet by mouth daily.    co-enzyme Q-10 30 MG capsule Take 30 mg by mouth daily.    Garlic 10 MG CAPS Take 1 capsule by mouth daily.    Multiple Vitamins-Minerals (EYE VITAMINS PO) Take 1 tablet by mouth daily.    Omega-3 Fatty Acids (FISH OIL) 1000 MG CPDR Take 1 capsule by mouth daily.    pantoprazole  (PROTONIX ) 40 MG tablet Take 1 tablet (40 mg total) by mouth daily.   RESVERATROL 100 MG CAPS Take 1 capsule by mouth daily.    No facility-administered encounter medications on file as of 09/20/2023.    Allergies (verified) Patient has no known allergies.   History: Past Medical History:  Diagnosis Date   History of basal cell carcinoma 06/16/2014   2013 Left ear    History of colonic polyps 06/16/2014   Adenoma 2010. Advised 5 year follow up.     Past Surgical History:  Procedure Laterality Date   BIOPSY  12/17/2019   Procedure: BIOPSY;  Surgeon: Tobin Forts, MD;  Location: [facility] ENDOSCOPY;  Service: Endoscopy;;   ESOPHAGOGASTRODUODENOSCOPY (EGD) WITH PROPOFOL  N/A 12/17/2019   Procedure: ESOPHAGOGASTRODUODENOSCOPY (EGD) WITH PROPOFOL ;  Surgeon: Tobin Forts, MD;  Location[facility] ENDOSCOPY;  Service: Endoscopy;  Laterality: N/A;   HEMOSTASIS  CLIP PLACEMENT  12/17/2019   Procedure: HEMOSTASIS CLIP PLACEMENT;  Surgeon: Tobin Forts, MD;  Location: [facility] ENDOSCOPY;  Service: Endoscopy;;   HEMOSTASIS CONTROL  12/17/2019   Procedure: HEMOSTASIS CONTROL;  Surgeon: Tobin Forts, MD;  Location: [facility] ENDOSCOPY;  Service: Endoscopy;;   left eye cataract removal     Family History  Problem Relation Age of Onset   Stroke Mother        44, nonsmoker. lived to 29   Tuberculosis Father        died 3 months before patient born   Social History   Socioeconomic  History   Marital status: Married    Spouse name: Not on file   Number of children: 1   Years of education: Not on file   Highest education level: Not on file  Occupational History   Occupation: Retired     Comment: Public relations account executive   Tobacco Use   Smoking status: Former    Current packs/day: 0.00    Average packs/day: 1 pack/day for 15.0 years (15.0 ttl pk-yrs)    Types: Cigarettes    Start date: 09/08/1958    Quit date: 09/07/1973    Years since quitting: 50.0   Smokeless tobacco: Never  Vaping Use   Vaping status: Never Used  Substance and Sexual Activity   Alcohol use: No    Alcohol/week: 0.0 standard drinks of alcohol   Drug use: No   Sexual activity: Not on file  Other Topics Concern   Not on file  Social History Narrative   Married (wife Sheryle Donning patient of Dr. Arlene Ben). 1 son. No grandchildren.       Retired from Doctor, general practice: exercise, see movies, plays and musicals   Social Drivers of Health   Financial Resource Strain: Low Risk  (09/20/2023)   Overall Financial Resource Strain (CARDIA)    Difficulty of Paying Living Expenses: Not hard at all  Food Insecurity: No Food Insecurity (09/20/2023)   Hunger Vital Sign    Worried About Running Out of Food in the Last Year: Never true    Ran Out of Food in the Last Year: Never true  Transportation Needs: No Transportation Needs (09/20/2023)   PRAPARE - Administrator, Civil Service (Medical): No    Lack of Transportation (Non-Medical): No  Physical Activity: Insufficiently Active (09/20/2023)   Exercise Vital Sign    Days of Exercise per Week: 5 days    Minutes of Exercise per Session: 20 min  Stress: No Stress Concern Present (09/20/2023)   [employer] of Occupational Health - Occupational Stress Questionnaire    Feeling of Stress : Not at all  Social Connections: Moderately Integrated (09/20/2023)   Social Connection and Isolation Panel [NHANES]    Frequency of Communication with  Friends and Family: Twice a week    Frequency of Social Gatherings with Friends and Family: Once a week    Attends Religious Services: More than 4 times per year    Active Member of [employer] or Organizations: No    Attends Banker Meetings: Never    Marital Status: Married    Tobacco Counseling Counseling given: Not Answered    Clinical Intake:  Pre-visit preparation completed: Yes  Pain : No/denies pain     BMI - recorded: 21.95 Nutritional Status: BMI of 19-24  Normal Diabetes: No  Lab Results  Component Value Date   HGBA1C 5.2 12/18/2019   HGBA1C 5.3 12/17/2019  How often do you need to have someone help you when you read instructions, pamphlets, or other written materials from your doctor or pharmacy?: 1 - Never  Interpreter Needed?: No  Information entered by :: Lamont Pilsner, LPN   Activities of Daily Living     09/20/2023    9:24 AM  In your present state of health, do you have any difficulty performing the following activities:  Hearing? 0  Vision? 0  Difficulty concentrating or making decisions? 0  Walking or climbing stairs? 0  Dressing or bathing? 0  Doing errands, shopping? 0  Preparing Food and eating ? N  Using the Toilet? N  In the past six months, have you accidently leaked urine? N  Do you have problems with loss of bowel control? N  Managing your Medications? N  Managing your Finances? N  Housekeeping or managing your Housekeeping? N    Patient Care Team: Almira Jaeger, MD as PCP - General (Family Medicine) Dema Filler, MD as Consulting Physician (Ophthalmology)  Indicate any recent Medical Services you may have received from other than [facility] providers in the past year (date may be approximate).     Assessment:   This is a routine wellness examination for Trevion.  Hearing/Vision screen Hearing Screening - Comments:: Pt denies any hearing issues  Vision Screening - Comments:: Wears rx glasses - up to date  with routine eye exams with Dr Juanito Norma     Goals Addressed             This Visit's Progress    Patient Stated       Maintain health and activity        Depression Screen     09/20/2023    9:26 AM 09/15/2022    9:40 AM 09/17/2021    1:59 PM 09/14/2020    1:54 PM 07/17/2020   10:33 AM 07/17/2019    9:07 AM 07/04/2019    2:13 PM  PHQ 2/9 Scores  PHQ - 2 Score 0 0 0 0 0 0 0    Fall Risk     09/20/2023    9:28 AM 09/15/2022    9:45 AM 09/17/2021    2:02 PM 09/14/2020    1:59 PM 07/17/2020   10:33 AM  Fall Risk   Falls in the past year? 0 0 0 0 0  Number falls in past yr: 0 0 0 0 0  Injury with Fall? 0 0 0 0 0  Risk for fall due to : No Fall Risks Impaired balance/gait     Follow up Falls prevention discussed Falls prevention discussed  Falls prevention discussed     MEDICARE RISK AT HOME:  Medicare Risk at Home Any stairs in or around the home?: Yes If so, are there any without handrails?: No Home free of loose throw rugs in walkways, pet beds, electrical cords, etc?: Yes Adequate lighting in your home to reduce risk of falls?: Yes Life alert?: No Use of a cane, walker or w/c?: No Grab bars in the bathroom?: No Shower chair or bench in shower?: Yes Elevated toilet seat or a handicapped toilet?: No  TIMED UP AND GO:  Was the test performed?  No  Cognitive Function: 6CIT completed        09/20/2023    9:29 AM 09/15/2022    9:46 AM 09/17/2021    2:03 PM 09/14/2020    2:01 PM 07/04/2019    2:12 PM  6CIT Screen  What Year? 0 points  0 points 0 points 0 points 0 points  What month? 0 points 0 points 0 points 0 points 0 points  What time? 0 points 0 points 0 points  0 points  Count back from 20 0 points 0 points 0 points 0 points 0 points  Months in reverse 0 points 2 points 0 points 0 points 0 points  Repeat phrase 0 points 0 points 2 points 2 points   Total Score 0 points 2 points 2 points      Immunizations Immunization History  Administered Date(s) Administered    Fluad Quad(high Dose 65+) 02/19/2019, 02/26/2020, 02/09/2021, 02/17/2022   Influenza Split 03/13/2012   Influenza Whole 04/06/2007, 03/11/2008, 03/23/2009, 02/17/2010   Influenza, High Dose Seasonal PF 02/16/2017, 02/15/2018   Influenza,inj,Quad PF,6+ Mos 02/19/2013, 02/10/2014, 03/16/2016   Influenza-Unspecified 02/25/2015   PFIZER Comirnaty(Gray Top)Covid-19 Tri-Sucrose Vaccine 02/05/2023   PFIZER(Purple Top)SARS-COV-2 Vaccination 07/04/2019, 07/29/2019, 03/11/2020, 09/03/2020   PNEUMOCOCCAL CONJUGATE-20 01/13/2021   Pfizer(Comirnaty)Fall Seasonal Vaccine 12 years and older 09/08/2023   Pneumococcal Conjugate-13 06/17/2014   Pneumococcal Polysaccharide-23 02/17/2004   Respiratory Syncytial Virus Vaccine,Recomb Aduvanted(Arexvy) 04/04/2022   Td 01/29/1999   Tdap 10/12/2011   Zoster Recombinant(Shingrix) 11/20/2019, 05/01/2020   Zoster, Live 05/30/2008    Screening Tests Health Maintenance  Topic Date Due   DTaP/Tdap/Td (3 - Td or Tdap) 10/11/2021   INFLUENZA VACCINE  12/29/2023   COVID-19 Vaccine (7 - Pfizer risk 2024-25 season) 03/09/2024   Medicare Annual Wellness (AWV)  09/19/2024   Pneumonia Vaccine 50+ Years old  Completed   Zoster Vaccines- Shingrix  Completed   HPV VACCINES  Aged Out   Meningococcal B Vaccine  Aged Out    Health Maintenance  Health Maintenance Due  Topic Date Due   DTaP/Tdap/Td (3 - Td or Tdap) 10/11/2021   Health Maintenance Items Addressed: See Nurse Notes  Additional Screening:  Vision Screening: Recommended annual ophthalmology exams for early detection of glaucoma and other disorders of the eye.  Dental Screening: Recommended annual dental exams for proper oral hygiene  Community Resource Referral / Chronic Care Management: CRR required this visit?  No   CCM required this visit?  No     Plan:     I have personally reviewed and noted the following in the patient's chart:   Medical and social history Use of alcohol, tobacco or  illicit drugs  Current medications and supplements including opioid prescriptions. Patient is not currently taking opioid prescriptions. Functional ability and status Nutritional status Physical activity Advanced directives List of other physicians Hospitalizations, surgeries, and ER visits in previous 12 months Vitals Screenings to include cognitive, depression, and falls Referrals and appointments  In addition, I have reviewed and discussed with patient certain preventive protocols, quality metrics, and best practice recommendations. A written personalized care plan for preventive services as well as general preventive health recommendations were provided to patient.     Bruno Capri, LPN   1/61/0960   After Visit Summary: (Declined) Due to this being a telephonic visit, with patients personalized plan was offered to patient but patient Declined AVS at this time   Notes: Nothing significant to report at this time.

## 2023-09-20 NOTE — Patient Instructions (Signed)
 Brian Welch , Thank you for taking time to come for your Medicare Wellness Visit. I appreciate your ongoing commitment to your health goals. Please review the following plan we discussed and let me know if I can assist you in the future.   Referrals/Orders/Follow-Ups/Clinician Recommendations: maintain health and activity   This is a list of the screening recommended for you and due dates:  Health Maintenance  Topic Date Due   DTaP/Tdap/Td vaccine (3 - Td or Tdap) 10/11/2021   Flu Shot  12/29/2023   COVID-19 Vaccine (7 - Pfizer risk 2024-25 season) 03/09/2024   Medicare Annual Wellness Visit  09/19/2024   Pneumonia Vaccine  Completed   Zoster (Shingles) Vaccine  Completed   HPV Vaccine  Aged Out   Meningitis B Vaccine  Aged Out    Advanced directives: (Declined) Advance directive discussed with you today. Even though you declined this today, please call our office should you change your mind, and we can give you the proper paperwork for you to fill out.  Next Medicare Annual Wellness Visit scheduled for next year: Yes

## 2024-01-04 ENCOUNTER — Encounter: Payer: Self-pay | Admitting: Family Medicine

## 2024-01-04 ENCOUNTER — Ambulatory Visit (INDEPENDENT_AMBULATORY_CARE_PROVIDER_SITE_OTHER): Payer: Medicare HMO | Admitting: Family Medicine

## 2024-01-04 VITALS — BP 128/74 | HR 59 | Temp 98.0°F | Ht 70.0 in | Wt 155.2 lb

## 2024-01-04 DIAGNOSIS — E785 Hyperlipidemia, unspecified: Secondary | ICD-10-CM

## 2024-01-04 DIAGNOSIS — Z0000 Encounter for general adult medical examination without abnormal findings: Secondary | ICD-10-CM | POA: Diagnosis not present

## 2024-01-04 LAB — CBC WITH DIFFERENTIAL/PLATELET
Basophils Absolute: 0.1 K/uL (ref 0.0–0.1)
Basophils Relative: 0.9 % (ref 0.0–3.0)
Eosinophils Absolute: 0.3 K/uL (ref 0.0–0.7)
Eosinophils Relative: 4.5 % (ref 0.0–5.0)
HCT: 45.1 % (ref 39.0–52.0)
Hemoglobin: 15.2 g/dL (ref 13.0–17.0)
Lymphocytes Relative: 25.9 % (ref 12.0–46.0)
Lymphs Abs: 1.8 K/uL (ref 0.7–4.0)
MCHC: 33.7 g/dL (ref 30.0–36.0)
MCV: 98.8 fl (ref 78.0–100.0)
Monocytes Absolute: 0.6 K/uL (ref 0.1–1.0)
Monocytes Relative: 8.7 % (ref 3.0–12.0)
Neutro Abs: 4.2 K/uL (ref 1.4–7.7)
Neutrophils Relative %: 60 % (ref 43.0–77.0)
Platelets: 220 K/uL (ref 150.0–400.0)
RBC: 4.56 Mil/uL (ref 4.22–5.81)
RDW: 13.2 % (ref 11.5–15.5)
WBC: 7 K/uL (ref 4.0–10.5)

## 2024-01-04 MED ORDER — PANTOPRAZOLE SODIUM 40 MG PO TBEC: 40.0000 mg | DELAYED_RELEASE_TABLET | Freq: Every day | ORAL | 3 refills | Status: AC

## 2024-01-04 NOTE — Patient Instructions (Addendum)
 Health Maintenance Due  Topic Date Due   DTaP/Tdap/Td (3 - Td or Tdap) 10/11/2021  recommended Tdap at pharmacy, consider COVID and flu shot in the fall-and let us  know if he receives these  Please stop by lab before you go If you have mychart- we will send your results within 3 business days of us  receiving them.  If you do not have mychart- we will call you about results within 5 business days of us  receiving them.  *please also note that you will see labs on mychart as soon as they post. I will later go in and write notes on them- will say notes from Dr. Katrinka   Recommended follow up: Return in about 1 year (around 01/03/2025) for physical or sooner if needed.Schedule b4 you leave.

## 2024-01-04 NOTE — Progress Notes (Signed)
 Phone: [phone]   Subjective:  Patient presents today for their annual physical. Chief complaint-noted.   See problem oriented charting- ROS- full  review of systems was completed and negative  Per full ROS sheet completed by patient except for topics noted under acute/chronic concerns  The following were reviewed and entered/updated in epic: Past Medical History:  Diagnosis Date   History of basal cell carcinoma 06/16/2014   2013 Left ear    History of colonic polyps 06/16/2014   Adenoma 2010. Advised 5 year follow up.     Patient Active Problem List   Diagnosis Date Noted   Acute blood loss anemia     Priority: High   Duodenal ulcer hemorrhagic     Priority: High   Pre-syncope 12/16/2019    Priority: Medium    Hyperlipidemia 07/17/2019    Priority: Medium    History of colonic polyps 06/16/2014    Priority: Medium    History of basal cell carcinoma 06/16/2014    Priority: Low   Past Surgical History:  Procedure Laterality Date   BIOPSY  12/17/2019   Procedure: BIOPSY;  Surgeon: Abran Norleen SAILOR, MD;  Location: [facility] ENDOSCOPY;  Service: Endoscopy;;   ESOPHAGOGASTRODUODENOSCOPY (EGD) WITH PROPOFOL  N/A 12/17/2019   Procedure: ESOPHAGOGASTRODUODENOSCOPY (EGD) WITH PROPOFOL ;  Surgeon: Abran Norleen SAILOR, MD;  Location[facility] END[facility]COPY;  Service: Endoscopy;  Laterality: N/A;   HEMOSTASIS CLIP PLACEMENT  12/17/2019   Procedure: HEMOSTASIS CLIP PLACEMENT;  Surgeon: Abran Norleen SAILOR, MD;  Location: [facility] ENDOSCOPY;  Service: Endoscopy;;   HEMOSTASIS CONTROL  12/17/2019   Procedure: HEMOSTASIS CONTROL;  Surgeon: Abran Norleen SAILOR, MD;  Location: [facility] ENDOSCOPY;  Service: Endoscopy;;   left eye cataract removal      Family History  Problem Relation Age of Onset   Stroke Mother        29, nonsmoker. lived to 66   Tuberculosis Father        died 3 months before patient born    Medications- reviewed and updated Current Outpatient Medications  Medication Sig Dispense Refill   Cholecalciferol  (VITAMIN D-3 PO) Take 1 tablet by mouth daily.      co-enzyme Q-10 30 MG capsule Take 30 mg by mouth daily.      Garlic 10 MG CAPS Take 1 capsule by mouth daily.      Multiple Vitamins-Minerals (EYE VITAMINS PO) Take 1 tablet by mouth daily.      Omega-3 Fatty Acids (FISH OIL) 1000 MG CPDR Take 1 capsule by mouth daily.      RESVERATROL 100 MG CAPS Take 1 capsule by mouth daily.      pantoprazole  (PROTONIX ) 40 MG tablet Take 1 tablet (40 mg total) by mouth daily. 90 tablet 3   No current facility-administered medications for this visit.    Allergies-reviewed and updated No Known Allergies  Social History   Social History Narrative   Married (wife Niels patient of Dr. Katrinka). 1 son. No grandchildren.       Retired from Doctor, general practice: exercise, see movies, plays and musicals   Objective  Objective:  BP 128/74 (BP Location: Right Arm, Patient Position: Sitting, Cuff Size: Normal)   Pulse (!) 59   Temp 98 F (36.7 C) (Temporal)   Ht 5' 10 (1.778 m)   Wt 155 lb 3.2 oz (70.4 kg)   SpO2 94%   BMI 22.27 kg/m  Gen: NAD, resting comfortably HEENT: Mucous membranes are moist. Oropharynx normal Neck: no thyromegaly  CV: RRR no murmurs rubs or gallops Lungs: CTAB no crackles, wheeze, rhonchi Abdomen: soft/nontender/nondistended/normal bowel sounds. No rebound or guarding.  Ext: no edema Skin: warm, dry Neuro: grossly normal, moves all extremities, PERRLA    Assessment and Plan  89 y.o. male presenting for annual physical.  Health Maintenance counseling: 1. Anticipatory guidance: Patient counseled regarding regular dental exams -q6 months, eye exams -yearly,  avoiding smoking and second hand smoke, limiting alcohol to 2 beverages per day - none, no illicit drugs .   2. Risk factor reduction:  Advised patient of need for regular exercise and diet rich and fruits and vegetables to reduce risk of heart attack and stroke.  Exercise-stretches in the morning  before getting out of bed and do some planking still as well as some push-ups and squats and some kettle bell exercises, continues to walk regularly in the morning for 20 minutes weather permitting at least 3x a week.  Diet/weight management-has maintained healthy weight.  Wt Readings from Last 3 Encounters:  01/04/24 155 lb 3.2 oz (70.4 kg)  09/20/23 153 lb (69.4 kg)  01/03/23 153 lb 6.4 oz (69.6 kg)  3. Immunizations/screenings/ancillary studies-recommended Tdap at pharmacy, consider COVID and flu shot in the falland let us  know if he receives these Immunization History  Administered Date(s) Administered   Fluad Quad(high Dose 65+) 02/19/2019, 02/26/2020, 02/09/2021, 02/17/2022   Influenza Split 03/13/2012   Influenza Whole 04/06/2007, 03/11/2008, 03/23/2009, 02/17/2010   Influenza, High Dose Seasonal PF 02/16/2017, 02/15/2018   Influenza,inj,Quad PF,6+ Mos 02/19/2013, 02/10/2014, 03/16/2016   Influenza-Unspecified 02/25/2015   PFIZER Comirnaty(Gray Top)Covid-19 Tri-Sucrose Vaccine 02/05/2023   PFIZER(Purple Top)SARS-COV-2 Vaccination 07/04/2019, 07/29/2019, 03/11/2020, 09/03/2020   PNEUMOCOCCAL CONJUGATE-20 01/13/2021   Pfizer(Comirnaty)Fall Seasonal Vaccine 12 years and older 09/08/2023   Pneumococcal Conjugate-13 06/17/2014   Pneumococcal Polysaccharide-23 02/17/2004   Respiratory Syncytial Virus Vaccine,Recomb Aduvanted(Arexvy) 04/04/2022   Td 01/29/1999   Tdap 10/12/2011   Zoster Recombinant(Shingrix) 11/20/2019, 05/01/2020   Zoster, Live 05/30/2008  4. Prostate cancer screening- past age based screening recommendations  5. Colon cancer screening - history of adenomatous colon polyps but over age 53 5 GI believes risks of colonoscopy outweigh benefits and has been released 6. Skin cancer screening-no dermatologist. advised regular sunscreen use. Denies worrisome, changing, or new skin lesions.  7. Smoking associated screening (lung cancer screening, AAA screen 65-75, UA)-former  smoker-no regular screenings required 8. 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  Hasn't been a good year for his garden.   # Raspy voice/hoarseness-had referred to ENT in the past but he had not been seen at last year's visit we offered repeat referral and he declined-declines again today  #hyperlipidemia S: Medication: Fish oil and coenzyme every 10 Lab Results  Component Value Date   CHOL 174 01/03/2023   HDL 41.90 01/03/2023   LDLCALC 100 (H) 01/03/2023   LDLDIRECT 116.0 12/31/2021   TRIG 159.0 (H) 01/03/2023   CHOLHDL 4 01/03/2023   A/P: Mild elevations but his age would not recommend medicine but no history of coronary disease or stroke  # History of duodenal ulcer leading to acute blood loss anemia-indefinite PPI given severity of symptoms-refill as needed.  B12 within normal last 2 checks-consider recheck next year.  No reflux or blood in the stool    #olive oil before bed and sleeps 8 hours! Also does some garlic and olive oil before breakfast.  Does some flaxeed, quinoa cooked- oatmeal, collegen, blueberries and protein powder in water. Several other tricks that help him do so well!   Recommended follow up: Return in about 1 year (around 01/03/2025) for physical or sooner if needed.Schedule b4 you leave. Future Appointments  Date Time Provider Department Center  09/24/2024 10:00 AM [facility] ANNUAL WELLNESS VISIT 1 [facility]   Lab/Order associations:NOT fasting   ICD-10-CM   1. Routine general medical examination at a health care facility  Z00.00     2. Hyperlipidemia, unspecified hyperlipidemia type  E78.5 Comp Met (CMET)    CBC w/Diff    Lipid panel      Meds ordered this encounter  Medications   pantoprazole  (PROTONIX ) 40 MG tablet    Sig: Take 1 tablet (40 mg total) by mouth daily.    Dispense:  90 tablet    Refill:  3   Return  precautions advised.  Garnette Lukes, MD

## 2024-01-05 ENCOUNTER — Ambulatory Visit: Payer: Self-pay | Admitting: Family Medicine

## 2024-01-05 LAB — COMPREHENSIVE METABOLIC PANEL WITH GFR
ALT: 18 U/L (ref 0–53)
AST: 19 U/L (ref 0–37)
Albumin: 4.3 g/dL (ref 3.5–5.2)
Alkaline Phosphatase: 42 U/L (ref 39–117)
BUN: 15 mg/dL (ref 6–23)
CO2: 26 meq/L (ref 19–32)
Calcium: 9.2 mg/dL (ref 8.4–10.5)
Chloride: 103 meq/L (ref 96–112)
Creatinine, Ser: 0.92 mg/dL (ref 0.40–1.50)
GFR: 73.48 mL/min (ref >60.00)
Glucose, Bld: 106 mg/dL — ABNORMAL HIGH (ref 70–99)
Potassium: 4.3 meq/L (ref 3.5–5.1)
Sodium: 141 meq/L (ref 135–145)
Total Bilirubin: 0.6 mg/dL (ref 0.2–1.2)
Total Protein: 7.2 g/dL (ref 6.0–8.3)

## 2024-01-05 LAB — LIPID PANEL
Cholesterol: 187 mg/dL (ref 0–200)
HDL: 45.7 mg/dL (ref >39.00)
LDL Cholesterol: 107 mg/dL — ABNORMAL HIGH (ref 0–99)
NonHDL: 141.76
Total CHOL/HDL Ratio: 4
Triglycerides: 174 mg/dL — ABNORMAL HIGH (ref 0.0–149.0)
VLDL: 34.8 mg/dL (ref 0.0–40.0)

## 2024-01-15 NOTE — Telephone Encounter (Signed)
 Patient called the office to return a call from [place]. Informed patient I would let Leotis know he called back.

## 2024-03-26 DIAGNOSIS — Z961 Presence of intraocular lens: Secondary | ICD-10-CM | POA: Diagnosis not present

## 2024-03-26 DIAGNOSIS — H2511 Age-related nuclear cataract, right eye: Secondary | ICD-10-CM | POA: Diagnosis not present

## 2024-03-26 DIAGNOSIS — H35373 Puckering of macula, bilateral: Secondary | ICD-10-CM | POA: Diagnosis not present

## 2024-09-24 ENCOUNTER — Ambulatory Visit

## 2025-01-06 ENCOUNTER — Encounter: Admitting: Family Medicine
